# Patient Record
Sex: Male | Born: 1935 | Race: White | Hispanic: No | Marital: Single | Smoking: Current every day smoker
Health system: Southern US, Community
[De-identification: ages and names within clinical notes are randomized; demographics above are authoritative.]

## PROBLEM LIST (undated history)

## (undated) DIAGNOSIS — E039 Hypothyroidism, unspecified: Secondary | ICD-10-CM

## (undated) DIAGNOSIS — D649 Anemia, unspecified: Secondary | ICD-10-CM

## (undated) DIAGNOSIS — N529 Male erectile dysfunction, unspecified: Secondary | ICD-10-CM

## (undated) DIAGNOSIS — K819 Cholecystitis, unspecified: Secondary | ICD-10-CM

## (undated) DIAGNOSIS — K635 Polyp of colon: Secondary | ICD-10-CM

## (undated) DIAGNOSIS — R0602 Shortness of breath: Secondary | ICD-10-CM

## (undated) DIAGNOSIS — R600 Localized edema: Secondary | ICD-10-CM

## (undated) DIAGNOSIS — Z8249 Family history of ischemic heart disease and other diseases of the circulatory system: Secondary | ICD-10-CM

## (undated) DIAGNOSIS — I4510 Unspecified right bundle-branch block: Secondary | ICD-10-CM

## (undated) DIAGNOSIS — R0609 Other forms of dyspnea: Secondary | ICD-10-CM

## (undated) DIAGNOSIS — R0600 Dyspnea, unspecified: Secondary | ICD-10-CM

## (undated) DIAGNOSIS — IMO0002 Reserved for concepts with insufficient information to code with codable children: Secondary | ICD-10-CM

## (undated) DIAGNOSIS — I639 Cerebral infarction, unspecified: Secondary | ICD-10-CM

## (undated) DIAGNOSIS — J449 Chronic obstructive pulmonary disease, unspecified: Secondary | ICD-10-CM

## (undated) DIAGNOSIS — D126 Benign neoplasm of colon, unspecified: Secondary | ICD-10-CM

## (undated) HISTORY — DX: Anemia, unspecified: D64.9

## (undated) HISTORY — DX: Reserved for concepts with insufficient information to code with codable children: IMO0002

## (undated) HISTORY — DX: Unspecified right bundle-branch block: I45.10

## (undated) HISTORY — DX: Cerebral infarction, unspecified: I63.9

## (undated) HISTORY — PX: COLONOSCOPY: SHX174

## (undated) HISTORY — PX: ESOPHAGOGASTRODUODENOSCOPY: SHX1529

## (undated) HISTORY — DX: Polyp of colon: K63.5

## (undated) HISTORY — DX: Family history of ischemic heart disease and other diseases of the circulatory system: Z82.49

## (undated) HISTORY — PX: CATARACT EXTRACTION, BILATERAL: SHX1313

## (undated) HISTORY — DX: Hypothyroidism, unspecified: E03.9

## (undated) HISTORY — DX: Other forms of dyspnea: R06.09

## (undated) HISTORY — DX: Cholecystitis, unspecified: K81.9

## (undated) HISTORY — DX: Benign neoplasm of colon, unspecified: D12.6

## (undated) HISTORY — DX: Dyspnea, unspecified: R06.00

## (undated) HISTORY — DX: Male erectile dysfunction, unspecified: N52.9

---

## 1996-10-16 DIAGNOSIS — I639 Cerebral infarction, unspecified: Secondary | ICD-10-CM

## 1996-10-16 HISTORY — PX: HERNIA REPAIR: SHX51

## 1996-10-16 HISTORY — DX: Cerebral infarction, unspecified: I63.9

## 1998-10-16 HISTORY — PX: EYE SURGERY: SHX253

## 2002-10-16 HISTORY — PX: OTHER SURGICAL HISTORY: SHX169

## 2002-11-07 ENCOUNTER — Encounter: Payer: Self-pay | Admitting: Surgery

## 2002-11-07 ENCOUNTER — Inpatient Hospital Stay (HOSPITAL_COMMUNITY): Admission: EM | Admit: 2002-11-07 | Discharge: 2002-11-14 | Payer: Self-pay | Admitting: Surgery

## 2002-11-07 ENCOUNTER — Encounter (INDEPENDENT_AMBULATORY_CARE_PROVIDER_SITE_OTHER): Payer: Self-pay

## 2004-08-24 ENCOUNTER — Ambulatory Visit: Payer: Self-pay | Admitting: Family Medicine

## 2004-09-07 ENCOUNTER — Ambulatory Visit: Payer: Self-pay | Admitting: Family Medicine

## 2004-09-28 ENCOUNTER — Ambulatory Visit: Payer: Self-pay | Admitting: Family Medicine

## 2005-08-04 ENCOUNTER — Ambulatory Visit: Payer: Self-pay | Admitting: Family Medicine

## 2005-09-11 ENCOUNTER — Ambulatory Visit: Payer: Self-pay | Admitting: Family Medicine

## 2005-09-11 ENCOUNTER — Encounter: Admission: RE | Admit: 2005-09-11 | Discharge: 2005-09-11 | Payer: Self-pay | Admitting: Family Medicine

## 2006-11-20 ENCOUNTER — Ambulatory Visit: Payer: Self-pay | Admitting: Family Medicine

## 2006-11-20 LAB — CONVERTED CEMR LAB
ALT: 13 units/L (ref 0–40)
AST: 20 units/L (ref 0–37)
Albumin: 3.9 g/dL (ref 3.5–5.2)
BUN: 19 mg/dL (ref 6–23)
Basophils Absolute: 0.1 10*3/uL (ref 0.0–0.1)
Basophils Relative: 1.2 % — ABNORMAL HIGH (ref 0.0–1.0)
CO2: 31 meq/L (ref 19–32)
Calcium: 9.3 mg/dL (ref 8.4–10.5)
Chloride: 107 meq/L (ref 96–112)
Cholesterol: 171 mg/dL (ref 0–200)
Creatinine, Ser: 1.4 mg/dL (ref 0.4–1.5)
Eosinophils Absolute: 0.2 10*3/uL (ref 0.0–0.6)
Eosinophils Relative: 3.7 % (ref 0.0–5.0)
GFR calc Af Amer: 64 mL/min
GFR calc non Af Amer: 53 mL/min
Glucose, Bld: 97 mg/dL (ref 70–99)
HCT: 43.3 % (ref 39.0–52.0)
HDL: 42.2 mg/dL (ref >39.0)
Hemoglobin: 15.1 g/dL (ref 13.0–17.0)
LDL Cholesterol: 115 mg/dL — ABNORMAL HIGH (ref 0–99)
Lymphocytes Relative: 29.5 % (ref 12.0–46.0)
MCHC: 34.8 g/dL (ref 30.0–36.0)
MCV: 84.9 fL (ref 78.0–100.0)
Monocytes Absolute: 0.6 10*3/uL (ref 0.2–0.7)
Monocytes Relative: 9.4 % (ref 3.0–11.0)
Neutro Abs: 3.8 10*3/uL (ref 1.4–7.7)
Neutrophils Relative %: 56.2 % (ref 43.0–77.0)
PSA: 2.22 ng/mL
PSA: 2.22 ng/mL (ref 0.10–4.00)
Phosphorus: 3 mg/dL (ref 2.3–4.6)
Platelets: 191 10*3/uL (ref 150–400)
Potassium: 4.4 meq/L (ref 3.5–5.1)
RBC: 5.1 M/uL (ref 4.22–5.81)
RDW: 14 % (ref 11.5–14.6)
Sodium: 143 meq/L (ref 135–145)
TSH: 0.61 microintl units/mL
TSH: 0.61 microintl units/mL (ref 0.35–5.50)
Total CHOL/HDL Ratio: 4.1
Triglycerides: 67 mg/dL (ref 0–149)
VLDL: 13 mg/dL (ref 0–40)
WBC: 6.7 10*3/uL (ref 4.5–10.5)

## 2006-12-04 ENCOUNTER — Ambulatory Visit: Payer: Self-pay | Admitting: Family Medicine

## 2006-12-19 ENCOUNTER — Ambulatory Visit: Payer: Self-pay | Admitting: Family Medicine

## 2007-07-04 ENCOUNTER — Ambulatory Visit: Payer: Self-pay | Admitting: Family Medicine

## 2007-07-04 DIAGNOSIS — I6789 Other cerebrovascular disease: Secondary | ICD-10-CM | POA: Insufficient documentation

## 2007-07-04 DIAGNOSIS — M5137 Other intervertebral disc degeneration, lumbosacral region: Secondary | ICD-10-CM

## 2007-07-04 DIAGNOSIS — E039 Hypothyroidism, unspecified: Secondary | ICD-10-CM | POA: Insufficient documentation

## 2007-07-25 ENCOUNTER — Telehealth: Payer: Self-pay | Admitting: Family Medicine

## 2008-07-22 ENCOUNTER — Ambulatory Visit: Payer: Self-pay | Admitting: Family Medicine

## 2008-07-22 DIAGNOSIS — Z860100 Personal history of colon polyps, unspecified: Secondary | ICD-10-CM | POA: Insufficient documentation

## 2008-07-22 DIAGNOSIS — L723 Sebaceous cyst: Secondary | ICD-10-CM

## 2008-07-22 DIAGNOSIS — J449 Chronic obstructive pulmonary disease, unspecified: Secondary | ICD-10-CM | POA: Insufficient documentation

## 2008-07-23 LAB — CONVERTED CEMR LAB
ALT: 12 units/L (ref 0–53)
AST: 19 units/L (ref 0–37)
Albumin: 4.1 g/dL (ref 3.5–5.2)
Alkaline Phosphatase: 76 units/L (ref 39–117)
BUN: 18 mg/dL (ref 6–23)
Basophils Absolute: 0.1 10*3/uL (ref 0.0–0.1)
Basophils Relative: 1.4 % (ref 0.0–3.0)
Bilirubin, Direct: 0.1 mg/dL (ref 0.0–0.3)
CO2: 34 meq/L — ABNORMAL HIGH (ref 19–32)
Calcium: 9 mg/dL (ref 8.4–10.5)
Chloride: 104 meq/L (ref 96–112)
Cholesterol: 164 mg/dL (ref 0–200)
Creatinine, Ser: 1.2 mg/dL (ref 0.4–1.5)
Eosinophils Absolute: 0.4 10*3/uL (ref 0.0–0.7)
Eosinophils Relative: 5.8 % — ABNORMAL HIGH (ref 0.0–5.0)
Free T4: 1.7 ng/dL — ABNORMAL HIGH (ref 0.6–1.6)
GFR calc Af Amer: 76 mL/min
GFR calc non Af Amer: 63 mL/min
Glucose, Bld: 100 mg/dL — ABNORMAL HIGH (ref 70–99)
HCT: 41 % (ref 39.0–52.0)
HDL: 42 mg/dL (ref >39.0)
Hemoglobin: 13.5 g/dL (ref 13.0–17.0)
LDL Cholesterol: 109 mg/dL — ABNORMAL HIGH (ref 0–99)
Lymphocytes Relative: 29.4 % (ref 12.0–46.0)
MCHC: 33 g/dL (ref 30.0–36.0)
MCV: 80.1 fL (ref 78.0–100.0)
Monocytes Absolute: 0.6 10*3/uL (ref 0.1–1.0)
Monocytes Relative: 9.3 % (ref 3.0–12.0)
Neutro Abs: 3.5 10*3/uL (ref 1.4–7.7)
Neutrophils Relative %: 54.1 % (ref 43.0–77.0)
PSA: 2.35 ng/mL (ref 0.10–4.00)
Phosphorus: 3.1 mg/dL (ref 2.3–4.6)
Platelets: 214 10*3/uL (ref 150–400)
Potassium: 5 meq/L (ref 3.5–5.1)
RBC: 5.12 M/uL (ref 4.22–5.81)
RDW: 14.9 % — ABNORMAL HIGH (ref 11.5–14.6)
Sodium: 144 meq/L (ref 135–145)
TSH: 0.48 microintl units/mL (ref 0.35–5.50)
Total Bilirubin: 0.5 mg/dL (ref 0.3–1.2)
Total CHOL/HDL Ratio: 3.9
Total Protein: 7.8 g/dL (ref 6.0–8.3)
Triglycerides: 63 mg/dL (ref 0–149)
VLDL: 13 mg/dL (ref 0–40)
WBC: 6.5 10*3/uL (ref 4.5–10.5)

## 2008-08-05 ENCOUNTER — Ambulatory Visit: Payer: Self-pay | Admitting: Internal Medicine

## 2008-08-20 ENCOUNTER — Ambulatory Visit: Payer: Self-pay | Admitting: Internal Medicine

## 2008-08-20 ENCOUNTER — Encounter: Payer: Self-pay | Admitting: Internal Medicine

## 2008-08-20 LAB — HM COLONOSCOPY

## 2008-08-27 ENCOUNTER — Encounter: Payer: Self-pay | Admitting: Internal Medicine

## 2008-08-27 ENCOUNTER — Encounter: Payer: Self-pay | Admitting: Family Medicine

## 2009-07-29 ENCOUNTER — Ambulatory Visit: Payer: Self-pay | Admitting: Family Medicine

## 2009-09-14 ENCOUNTER — Ambulatory Visit: Payer: Self-pay | Admitting: Family Medicine

## 2009-09-14 DIAGNOSIS — R634 Abnormal weight loss: Secondary | ICD-10-CM

## 2009-09-14 DIAGNOSIS — F17200 Nicotine dependence, unspecified, uncomplicated: Secondary | ICD-10-CM | POA: Insufficient documentation

## 2009-09-15 ENCOUNTER — Encounter (INDEPENDENT_AMBULATORY_CARE_PROVIDER_SITE_OTHER): Payer: Self-pay | Admitting: *Deleted

## 2009-09-15 ENCOUNTER — Encounter: Admission: RE | Admit: 2009-09-15 | Discharge: 2009-09-15 | Payer: Self-pay | Admitting: Family Medicine

## 2009-09-15 LAB — CONVERTED CEMR LAB
ALT: 12 units/L (ref 0–53)
AST: 22 units/L (ref 0–37)
Basophils Relative: 1.5 % (ref 0.0–3.0)
CO2: 32 meq/L (ref 19–32)
Calcium: 9.4 mg/dL (ref 8.4–10.5)
Chloride: 102 meq/L (ref 96–112)
Creatinine, Ser: 1.1 mg/dL (ref 0.4–1.5)
Eosinophils Relative: 5.7 % — ABNORMAL HIGH (ref 0.0–5.0)
GFR calc non Af Amer: 69.48 mL/min (ref >60)
HCT: 39.5 % (ref 39.0–52.0)
HDL: 44.6 mg/dL (ref >39.00)
Hemoglobin: 12.8 g/dL — ABNORMAL LOW (ref 13.0–17.0)
Lymphs Abs: 2.2 10*3/uL (ref 0.7–4.0)
MCV: 79.2 fL (ref 78.0–100.0)
Monocytes Absolute: 0.6 10*3/uL (ref 0.1–1.0)
Monocytes Relative: 8 % (ref 3.0–12.0)
Neutro Abs: 3.9 10*3/uL (ref 1.4–7.7)
PSA: 1.98 ng/mL (ref 0.10–4.00)
Potassium: 4.1 meq/L (ref 3.5–5.1)
RBC: 4.99 M/uL (ref 4.22–5.81)
Sodium: 140 meq/L (ref 135–145)
Total Protein: 8 g/dL (ref 6.0–8.3)
WBC: 7.2 10*3/uL (ref 4.5–10.5)

## 2009-10-29 ENCOUNTER — Ambulatory Visit: Payer: Self-pay | Admitting: Family Medicine

## 2009-11-01 LAB — CONVERTED CEMR LAB
Free T4: 1.2 ng/dL (ref 0.6–1.6)
TSH: 1.35 microintl units/mL (ref 0.35–5.50)

## 2010-07-13 ENCOUNTER — Telehealth: Payer: Self-pay | Admitting: Family Medicine

## 2010-07-19 ENCOUNTER — Ambulatory Visit: Payer: Self-pay | Admitting: Family Medicine

## 2010-09-19 ENCOUNTER — Telehealth (INDEPENDENT_AMBULATORY_CARE_PROVIDER_SITE_OTHER): Payer: Self-pay | Admitting: *Deleted

## 2010-09-20 ENCOUNTER — Ambulatory Visit: Payer: Self-pay | Admitting: Family Medicine

## 2010-09-21 LAB — CONVERTED CEMR LAB
ALT: 8 units/L (ref 0–53)
Albumin: 3.9 g/dL (ref 3.5–5.2)
Basophils Absolute: 0.1 10*3/uL (ref 0.0–0.1)
Calcium: 9 mg/dL (ref 8.4–10.5)
Creatinine, Ser: 1.1 mg/dL (ref 0.4–1.5)
Eosinophils Absolute: 0.3 10*3/uL (ref 0.0–0.7)
Glucose, Bld: 106 mg/dL — ABNORMAL HIGH (ref 70–99)
HCT: 31.5 % — ABNORMAL LOW (ref 39.0–52.0)
LDL Cholesterol: 86 mg/dL (ref 0–99)
Lymphs Abs: 2 10*3/uL (ref 0.7–4.0)
MCV: 65.9 fL — ABNORMAL LOW (ref 78.0–100.0)
Monocytes Absolute: 0.7 10*3/uL (ref 0.1–1.0)
Neutrophils Relative %: 52.4 % (ref 43.0–77.0)
PSA: 2.36 ng/mL (ref 0.10–4.00)
Phosphorus: 2.8 mg/dL (ref 2.3–4.6)
Platelets: 193 10*3/uL (ref 150.0–400.0)
Potassium: 4.7 meq/L (ref 3.5–5.1)
RDW: 18.6 % — ABNORMAL HIGH (ref 11.5–14.6)
Sodium: 142 meq/L (ref 135–145)
TSH: 0.65 microintl units/mL (ref 0.35–5.50)
Total CHOL/HDL Ratio: 4
Triglycerides: 46 mg/dL (ref 0.0–149.0)
WBC: 6.4 10*3/uL (ref 4.5–10.5)

## 2010-09-22 ENCOUNTER — Ambulatory Visit: Payer: Self-pay | Admitting: Family Medicine

## 2010-09-22 ENCOUNTER — Encounter (INDEPENDENT_AMBULATORY_CARE_PROVIDER_SITE_OTHER): Payer: Self-pay | Admitting: *Deleted

## 2010-09-22 DIAGNOSIS — D649 Anemia, unspecified: Secondary | ICD-10-CM

## 2010-09-25 LAB — CONVERTED CEMR LAB: Iron: 14 ug/dL — ABNORMAL LOW (ref 42–165)

## 2010-09-26 ENCOUNTER — Ambulatory Visit: Payer: Self-pay | Admitting: Family Medicine

## 2010-09-26 DIAGNOSIS — H6120 Impacted cerumen, unspecified ear: Secondary | ICD-10-CM | POA: Insufficient documentation

## 2010-09-26 DIAGNOSIS — K4090 Unilateral inguinal hernia, without obstruction or gangrene, not specified as recurrent: Secondary | ICD-10-CM | POA: Insufficient documentation

## 2010-09-26 LAB — CONVERTED CEMR LAB
HDL goal, serum: 40 mg/dL
LDL Goal: 130 mg/dL

## 2010-09-27 ENCOUNTER — Ambulatory Visit: Payer: Self-pay | Admitting: Family Medicine

## 2010-09-27 ENCOUNTER — Encounter (INDEPENDENT_AMBULATORY_CARE_PROVIDER_SITE_OTHER): Payer: Self-pay | Admitting: *Deleted

## 2010-09-27 ENCOUNTER — Ambulatory Visit: Payer: Self-pay | Admitting: Internal Medicine

## 2010-09-27 DIAGNOSIS — D509 Iron deficiency anemia, unspecified: Secondary | ICD-10-CM | POA: Insufficient documentation

## 2010-09-27 DIAGNOSIS — R195 Other fecal abnormalities: Secondary | ICD-10-CM

## 2010-09-28 LAB — FECAL OCCULT BLOOD, GUAIAC: Fecal Occult Blood: NEGATIVE

## 2010-10-03 ENCOUNTER — Encounter: Payer: Self-pay | Admitting: Family Medicine

## 2010-10-03 LAB — CONVERTED CEMR LAB: Fecal Occult Bld: NEGATIVE

## 2010-10-18 ENCOUNTER — Ambulatory Visit
Admission: RE | Admit: 2010-10-18 | Discharge: 2010-10-18 | Payer: Self-pay | Source: Home / Self Care | Attending: Family Medicine | Admitting: Family Medicine

## 2010-10-18 DIAGNOSIS — N529 Male erectile dysfunction, unspecified: Secondary | ICD-10-CM | POA: Insufficient documentation

## 2010-10-20 ENCOUNTER — Ambulatory Visit
Admission: RE | Admit: 2010-10-20 | Discharge: 2010-10-20 | Payer: Self-pay | Source: Home / Self Care | Attending: Internal Medicine | Admitting: Internal Medicine

## 2010-10-20 ENCOUNTER — Encounter: Payer: Self-pay | Admitting: Internal Medicine

## 2010-10-25 ENCOUNTER — Encounter: Payer: Self-pay | Admitting: Internal Medicine

## 2010-10-28 ENCOUNTER — Ambulatory Visit
Admission: RE | Admit: 2010-10-28 | Discharge: 2010-10-28 | Payer: Self-pay | Source: Home / Self Care | Attending: Internal Medicine | Admitting: Internal Medicine

## 2010-11-15 NOTE — Assessment & Plan Note (Signed)
Summary: FLU SHOT/CLE  Nurse Visit   Allergies: No Known Drug Allergies  Orders Added: 1)  Flu Vaccine 3yrs + MEDICARE PATIENTS [Q2039] 2)  Administration Flu vaccine - MCR [G0008]  Flu Vaccine Consent Questions     Do you have a history of severe allergic reactions to this vaccine? no    Any prior history of allergic reactions to egg and/or gelatin? no    Do you have a sensitivity to the preservative Thimersol? no    Do you have a past history of Guillan-Barre Syndrome? no    Do you currently have an acute febrile illness? no    Have you ever had a severe reaction to latex? no    Vaccine information given and explained to patient? yes    Are you currently pregnant? no    Lot Number:[id number]   Exp Date:04/15/2011   Site Given  Left Deltoid IMu  

## 2010-11-15 NOTE — Progress Notes (Signed)
Summary: needs generic synthroid  Phone Note From Pharmacy   [facility]  [address] [phone] Summary of Call: Pt has been getting brand synthroid, but this is now on manufacturer back order.  Pharmacy is asking if ok to give generic with next refill. Initial call taken by: Lowella Petties CMA,  July 13, 2010 11:49 AM  Follow-up for Phone Call        that is ok with me if ok with him--let the pt know please Follow-up by: Judith Part MD,  July 13, 2010 12:06 PM  Additional Follow-up for Phone Call Additional follow up Details #1::        Patient's wife notified as instructed by telephone. Pt's wife said pt already knew because had spoken with pharmacist. Sherron Monday with [facility]ona Rieger at [facility] notified as instructed by telephone. Lewanda Rife LPN  July 13, 2010 12:19 PM

## 2010-11-15 NOTE — Letter (Signed)
Summary: [facility]: Immunoassay Fecal Occult Blood (iFOB) Order Form  [facility] at [facility]  [address] [place], [place] [address]   Phone: [phone]  Fax: [phone]      [facility]: Immunoassay Fecal Occult Blood (iFOB) Order Form   September 22, 2010 MRN: [id number]   Carl Mcconnell 06/09/1933   Physicican Name:_____Marne Tower____________________  Diagnosis Code:_____286.9_____________________      Mills Koller

## 2010-11-15 NOTE — Progress Notes (Signed)
----   Converted from flag ---- ---- 09/18/2010 7:13 AM, Colon Flattery Tower MD wrote: lipid/ast/alt/renal / tsh/ cbc with diff/ psa -- for 244.9 and prostate screening and hx of cva thanks  ---- 09/16/2010 8:51 AM, Liane Comber CMA (AAMA) wrote: this order seemed like it could have been cut off, was this all you wanted to order Thanks Tasha  ---- 09/15/2010 9:58 PM, Colon Flattery Tower MD wrote: for 244.9 and cva and prostate screen lipid/ ast alt/ renal / tsh / cbc with diff / psa -- thanks  ---- 09/14/2010 10:55 AM, Liane Comber CMA (AAMA) wrote: Lab orders please! Good Morning! This pt is scheduled for cpx labs Tuesday, which labs to draw and dx codes to use? Thanks Tasha ------------------------------

## 2010-11-17 NOTE — Assessment & Plan Note (Signed)
Summary: CPX-BCBS PRIMARY , MEDICARE AND MEDICAID.Marland Kitchen CYD   Vital Signs:  Patient profile:   77 year old male Height:      66.5 inches Weight:      140.25 pounds BMI:     22.38 Temp:     97.4 degrees F oral Pulse rate:   60 / minute Pulse rhythm:   regular BP sitting:   124 / 82  (left arm) Cuff size:   regular  Vitals Entered By: Lewanda Rife LPN (September 26, 2010 10:47 AM) CC: CPX, Lipid Management   History of Present Illness: here for check up of chronic med conditions and also to [facility]  recent labs chol good Last Lipid ProfileCholesterol: 132 (09/20/2010 8:45:54 AM)HDL:  37.10 (09/20/2010 8:45:54 AM)LDL:  86 (09/20/2010 8:45:54 AM)Triglycerides:  Last Liver profileSGOT:  20 (09/20/2010 8:45:54 AM)SPGT:  8 (09/20/2010 8:45:54 AM)T. Bili:  0.6 (09/14/2009 3:09:36 PM)Alk Phos:  87 (09/14/2009 3:09:36 PM)   new anemia per pt he was anemic when very young  he eats once per day -- balanced diet  hb 9.8 ferritin 6.6 - appears to be iron dev B12 is 227- low nl  has not donated blood   has no energy and wants to sleep all the time  dozes off easily appetite is decreased   psa 2.36  (was 1.98) fairly stable  colonosc tic s and polyp 09- was supposed to have 5 y f/u   smoking status -- still smoker he tried the chantix -- on the starter pack did well  on the full dose got very nauseated  would like to try the low dose   Td 08 flu shot utd  ptx is 09    wt is down 5 lb-- eating less in general   bp is fine   has hx of inguinal hernia on L and now thinks he is getting mild one in R no pain- just a little bulge when he coughs- and it goes back in    Lipid Management History:      Positive NCEP/ATP III risk factors include male age 9 years old or older, HDL cholesterol less than 40, and current tobacco user.    Allergies (verified): 1)  ! Chantix (Varenicline Tartrate)  Past History:  Past Medical History: Last updated:  2008/08/09 Hypothyroidism CVA deg disk LS  smoker colon polyp  Past Surgical History: Last updated: 08/27/2008 Eye surgery (2000) Hernia surgery (1998) Rectal abscess , foreign body- surgery (10/2002) Carotid dopplers- neg (06/2003) Colonoscopy- diverticulosis, rectal polyp, hemorrhoids (08/2003) colonoscopy - diverticulosis and rectal polyp (11/09)- re check 5 y   surg Dr Carolynne Edouard, [facility]  Family History: Last updated: Aug 09, 2008 Father: died age 68- CVA Mother: CAD Siblings: brother died from MI, sister with diab, HTN no cancer in family  Social History: Last updated: 2008/08/09 Marital Status:divorced Children: one daughter with epilepsy, DM. 1 son- healthy Occupation: retired smokes 1ppd   Risk Factors: Smoking Status: current (07/04/2007)  Review of Systems General:  Complains of fatigue; denies chills, fever, and loss of appetite. Eyes:  Denies blurring and eye irritation. ENT:  Denies sore throat. CV:  Denies chest pain or discomfort, palpitations, shortness of breath with exertion, and swelling of feet. Resp:  Denies cough and shortness of breath. GI:  Denies abdominal pain, bloody stools, change in bowel habits, dark tarry stools, indigestion, loss of appetite, nausea, and vomiting. GU:  Denies hematuria and nocturia. MS:  Denies joint pain, joint redness, and  joint swelling. Derm:  Denies itching, lesion(s), poor wound healing, and rash. Neuro:  Denies headaches, numbness, and tingling. Psych:  Denies anxiety. Endo:  Denies cold intolerance, excessive thirst, and excessive urination. Heme:  Denies abnormal bruising and bleeding.  Physical Exam  General:  slim and well appearing but sallow complexion Head:  normocephalic, atraumatic, and no abnormalities observed.   Eyes:  vision grossly intact, pupils equal, pupils round, and pupils reactive to light.  no conjunctival pallor, injection or icterus  Ears:  bilateral cerumen  impaction- almost total with some dec in hearing  Nose:  no nasal discharge.   Mouth:  pharynx pink and moist.   Neck:  supple with full rom and no masses or thyromegally, no JVD or carotid bruit  Chest Wall:  No deformities, masses, tenderness or gynecomastia noted. Lungs:  Normal respiratory effort, chest expands symmetrically. Lungs are clear to auscultation, no crackles or wheezes. Heart:  Normal rate and regular rhythm. S1 and S2 normal without gallop, murmur, click, rub or other extra sounds. Abdomen:  Bowel sounds positive,abdomen soft and non-tender without masses, organomegally  very slight bulge on valsalva in R  groin area is reducible and nontender Rectal:  No external abnormalities noted. Normal sphincter tone. No rectal masses or tenderness. scant heme pos stool on guiac card  Genitalia:  Testes bilaterally descended without nodularity, tenderness or masses. No scrotal masses or lesions. No penis lesions or urethral discharge no inguinal hernia detected  Prostate:  Prostate gland firm and smooth, no enlargement, nodularity, tenderness, mass, asymmetry or induration. Msk:  No deformity or scoliosis noted of thoracic or lumbar spine.   Pulses:  R and L carotid,radial,femoral,dorsalis pedis and posterior tibial pulses are full and equal bilaterally Extremities:  No clubbing, cyanosis, edema, or deformity noted with normal full range of motion of all joints.   Neurologic:  sensation intact to light touch and gait normal.   Skin:  Intact without suspicious lesions or rashes Cervical Nodes:  No lymphadenopathy noted Inguinal Nodes:  No significant adenopathy Psych:  normal affect, talkative and pleasant    Impression & Recommendations:  Problem # 1:  UNSPECIFIED ANEMIA (ICD-285.9) Assessment Unchanged this is new and significant- iron def with scant heme pos stool today ref to GI for eval Orders: Gastroenterology Referral (GI)  Problem # 2:  INGUINAL HERNIA  (ICD-550.90) Assessment: New very slight bulge in R upper groin with straining but neg testicular  at this time will obs it given other issues if worse/bigger/pain- pt knows to call (he has had one before )  Problem # 3:  CERUMEN IMPACTION, BILATERAL (ICD-380.4) Assessment: Deteriorated this is getting bad again with dec hearing inst to use debrox 2 times weekly for 2-3 wk and f/u for simple ear irrigation  Problem # 4:  TOBACCO ABUSE (ICD-305.1) Assessment: Unchanged  overall tol the lower dose of chantix  will px that and see if we make progress  last cxr was last year  disc copd  The following medications were removed from the medication list:    Chantix Starting Month Pak 0.5 Mg X 11 & 1 Mg X 42 Tabs (Varenicline tartrate) .Marland Kitchen... Take by mouth as directed    Chantix 1 Mg Tabs (Varenicline tartrate) .Marland Kitchen... 1 by mouth two times a day after finishing starter pack His updated medication list for this problem includes:    Chantix 0.5 Mg Tabs (Varenicline tartrate) .Marland Kitchen... 1 by mouth two times a day to quit smoking  Orders: Prescription Created Electronically (  [email or web address])  Problem # 5:  WEIGHT LOSS (ICD-783.21) Assessment: Unchanged 5 more lb worrisome given the anemia  ref made to GI pt aware he is at higher risk of ca due to smoking  Problem # 6:  HYPOTHYROIDISM (ICD-244.9) Assessment: Unchanged  tsh is tx and stable fatigue is more likely coming from his anemia  His updated medication list for this problem includes:    Synthroid 125 Mcg Tabs (Levothyroxine sodium) .Marland Kitchen... Take one by mouth daily  Labs Reviewed: TSH: 0.65 (09/20/2010)    Chol: 132 (09/20/2010)   HDL: 37.10 (09/20/2010)   LDL: 86 (09/20/2010)   TG: 46.0 (09/20/2010)  Orders: Prescription Created Electronically ([id number])  Complete Medication List: 1)  Synthroid 125 Mcg Tabs (Levothyroxine sodium) .... Take one by mouth daily 2)  Adult Aspirin Low Strength 81 Mg Tbdp (Aspirin) .... One by mouth dialy 3)  Chantix  0.5 Mg Tabs (Varenicline tartrate) .Marland Kitchen.. 1 by mouth two times a day to quit smoking  Lipid Assessment/Plan:      Based on NCEP/ATP III, the patient's risk factor category is "2 or more risk factors and a calculated 10 year CAD risk of < 20%".  The patient's lipid goals are as follows: Total cholesterol goal is 200; LDL cholesterol goal is 130; HDL cholesterol goal is 40; Triglyceride goal is 150.     Patient Instructions: 1)  try low dose chantix again to quit smoking 2)  we will refer you to GI for anemia at check out  3)  please use debrox as directed over the counter twice weekly for 2-3 weeks in both ears to loosen wax 4)  then f/u here in 2-3 weeks to have ears flushed  5)  I think you may be developing a very early hernia in right groin-- watch this-- if it hurts or gets bigger -- we will refer you to a surgeon  Prescriptions: SYNTHROID 125 MCG TABS (LEVOTHYROXINE SODIUM) take one by mouth daily  #30 x 11   Entered and Authorized by:   Judith Part MD   Signed by:   Judith Part MD on 09/26/2010   Method used:   [facility]        [facility]  [address] [facility] [address] [id number]       [address]       [place], [place]  [address]       Ph: [phone]       Fax: [phone]   RxID:   [id number] CHANTIX 0.5 MG TABS (VARENICLINE TARTRATE) 1 by mouth two times a day to quit smoking  #60 x 3   Entered and Authorized by:   Judith Part MD   Signed by:   Judith Part MD on 09/26/2010   Method used:   [facility]        [facility]  [address] [facility] [address] [id number]       [address]       [place], [place]  [address]       Ph: [phone]       Fax: [phone]   RxID:   [id number]    Orders Added: 1)  Gastroenterology Referral [GI] 2)  Prescription Created Electronically [G8553] 3)  Est. Patient Level IV [93818]    Current Allergies (reviewed today): ! CHANTIX (VARENICLINE TARTRATE)

## 2010-11-17 NOTE — Letter (Signed)
Summary: EGD Instructions  [facility]  [address] [place], [place] [address]   Phone: [phone]  Fax: [phone]       Carl Mcconnell    03-05-1933    MRN: [id number]       Procedure Day Dorna Bloom: Lenor Coffin, 10/20/10     Arrival Time: 1:30 PM     Procedure Time: 2:30 PM    Location of Procedure:                    _ X_ [facility] (4th Floor)  PREPARATION FOR ENDOSCOPY   On THURSDAY, 10/20/10, THE DAY OF THE PROCEDURE:  1.   No solid foods, milk or milk products are allowed after midnight the night before your procedure.  2.   Do not drink anything colored red or purple.  Avoid juices with pulp.  No orange juice.  3.  You may drink clear liquids until 12:30 PM, which is 2 hours before your procedure.                                                                                                CLEAR LIQUIDS INCLUDE: Water Jello Ice Popsicles Tea (sugar ok, no milk/cream) Powdered fruit flavored drinks Coffee (sugar ok, no milk/cream) Gatorade Juice: apple, white grape, white cranberry  Lemonade Clear bullion, consomm, broth Carbonated beverages (any kind) Strained chicken noodle soup Hard Candy   MEDICATION INSTRUCTIONS  Unless otherwise instructed, you should take regular prescription medications with a small sip of water as early as possible the morning of your procedure.                   OTHER INSTRUCTIONS  You will need a responsible adult at least 75 years of age to accompany you and drive you home.   This person must remain in the waiting room during your procedure.  Wear loose fitting clothing that is easily removed.  Leave jewelry and other valuables at home.  However, you may wish to bring a book to read or an iPod/MP3 player to listen to music as you wait for your procedure to start.  Remove all body piercing jewelry and leave at home.  Total time from sign-in until discharge is approximately 2-3 hours.  You  should go home directly after your procedure and rest.  You can resume normal activities the day after your procedure.  The day of your procedure you should not:   Drive   Make legal decisions   Operate machinery   Drink alcohol   Return to work  You will receive specific instructions about eating, activities and medications before you leave.    The above instructions have been reviewed and explained to me by   _______________________    I fully understand and can verbalize these instructions _____________________________ Date _________

## 2010-11-17 NOTE — Assessment & Plan Note (Signed)
Summary: ear flush/mk   Vital Signs:  Patient profile:   77 year old male Height:      66.5 inches Weight:      148.50 pounds BMI:     23.69 Temp:     97.6 degrees F oral Pulse rate:   64 / minute Pulse rhythm:   regular BP sitting:   100 / 60  (left arm) Cuff size:   regular  Vitals Entered By: Lewanda Rife LPN (October 18, 2010 12:18 PM) CC: ear washing   History of Present Illness: here to get ears flushed -- has been using debrox   has chantix -- and no cig since saturday   is having some erectile dysfunction  has never tried viagra or other med  is not a big problem - does not want to address it yet  no drive problems    Allergies: 1)  ! Chantix (Varenicline Tartrate)  Past History:  Past Surgical History: Last updated: 10/16/10 Eye surgery (2000)-Left eye Hernia surgery (1998) Rectal abscess , foreign body- surgery (10/2002) Carotid dopplers- neg (06/2003) Colonoscopy- diverticulosis, rectal polyp, hemorrhoids (08/2003) colonoscopy - diverticulosis and rectal polyp (11/09)- re check 5 y   surg Dr Carolynne Edouard, [facility]-- [facility]  Family History: Last updated: Oct 16, 2010 Father: died age 23- CVA Mother: CAD Siblings: brother died from MI, sister with diab, HTN no cancer in family Family History of Diabetes: sister, daughter  Social History: Last updated: 2010-10-16 Marital Status:divorced lives with girlfriend > 20 yrs Children: one daughter with epilepsy, DM. 1 son- healthy Occupation: retired smokes 1ppd trying to quit Alcohol Use - no Daily Caffeine Use-8 cups coffee daily Illicit Drug Use - no Patient does not get regular exercise.   Risk Factors: Exercise: no (16-Oct-2010)  Risk Factors: Smoking Status: current (07/04/2007)  Past Medical History: Hypothyroidism CVA deg disk LS  smoker colon polyp ED anemia   Review of Systems General:  Denies fatigue, loss of appetite, and malaise. Eyes:  Denies blurring and eye  irritation. ENT:  Complains of decreased hearing; denies ear discharge and earache. CV:  Denies chest pain or discomfort, lightheadness, and palpitations. Resp:  Denies cough, shortness of breath, and wheezing. GI:  Denies abdominal pain, change in bowel habits, indigestion, and nausea. GU:  Complains of erectile dysfunction; denies dysuria and urinary frequency.  Physical Exam  General:  slim and well appearing Head:  normocephalic, atraumatic, and no abnormalities observed.   Eyes:  vision grossly intact, pupils equal, pupils round, and pupils reactive to light.   Ears:  total occlusion of soft wax bilat cleared with simple irrigation  hearing improved TMs nl appearing  Mouth:  pharynx pink and moist.   Neck:  supple with full rom and no masses or thyromegally, no JVD or carotid bruit  Lungs:  diffusely distant bs  Heart:  Normal rate and regular rhythm. S1 and S2 normal without gallop, murmur, click, rub or other extra sounds. Skin:  Intact without suspicious lesions or rashes Cervical Nodes:  No lymphadenopathy noted Psych:  normal affect, talkative and pleasant    Impression & Recommendations:  Problem # 1:  CERUMEN IMPACTION, BILATERAL (ICD-380.4) Assessment Improved much improved after use of debrox at home and then simple ear irrigation hearing is imp in office  urged to continue debrox use occas  Problem # 2:  TOBACCO ABUSE (ICD-305.1) Assessment: Improved hopeful that chantix will work-- no cig since sat  urged to keep working on it  His updated medication  list for this problem includes:    Chantix 0.5 Mg Tabs (Varenicline tartrate) .Marland Kitchen... 1 by mouth two times a day to quit smoking (not picked up yet)  Problem # 3:  ERECTILE DYSFUNCTION, ORGANIC (ICD-607.84) Assessment: New I suspect this may be related to blood flow/ vasc dz per pt - not bad enough for work up right now  is not interested in med yet handout given from [facility]  Complete Medication List: 1)   Synthroid 125 Mcg Tabs (Levothyroxine sodium) .... Take one by mouth daily 2)  Adult Aspirin Low Strength 81 Mg Tbdp (Aspirin) .... One by mouth dialy 3)  Chantix 0.5 Mg Tabs (Varenicline tartrate) .Marland Kitchen.. 1 by mouth two times a day to quit smoking (not picked up yet) 4)  Ferrous Sulfate 325 (65 Fe) Mg Tabs (Ferrous sulfate) .... Take 1 tablet by mouth two times a day  Patient Instructions: 1)  ears look good after irrigation  2)  let me know if any symptoms at all    Orders Added: 1)  Est. Patient Level III [[address]]    Prior Medications: SYNTHROID 125 MCG TABS (LEVOTHYROXINE SODIUM) take one by mouth daily ADULT ASPIRIN LOW STRENGTH 81 MG TBDP (ASPIRIN) one by mouth dialy CHANTIX 0.5 MG TABS (VARENICLINE TARTRATE) 1 by mouth two times a day to quit smoking (NOT PICKED UP YET) FERROUS SULFATE 325 (65 FE) MG TABS (FERROUS SULFATE) Take 1 tablet by mouth two times a day Current Allergies: ! CHANTIX (VARENICLINE TARTRATE)

## 2010-11-17 NOTE — Assessment & Plan Note (Signed)
Summary: IRON DEF ANEMIA/HEMOGLOBIN 9.8/YF   History of Present Illness Visit Type: Initial Consult Primary GI MD: Stan Head MD [facility] Primary Provider: Roxy Manns, MD Requesting Provider: Roxy Manns, MD Chief Complaint: Patient here for further evaluation of anemia. He denies any symptoms with the exception of weight loss and loss of appetite. History of Present Illness:   + Early satiety over past year along with anorexia and weight loss. Hgb fell from 12.8 last year to 9 this year and ferritin is low. He denies any bleeding but had a trace heme + stool on DRE yesterday.    GI Review of Systems    Reports loss of appetite and  weight loss.   Weight loss of 10 pounds over 1 year.   Denies abdominal pain, acid reflux, belching, bloating, chest pain, dysphagia with liquids, dysphagia with solids, heartburn, nausea, vomiting, vomiting blood, and  weight gain.        Denies black tarry stools, change in bowel habit, constipation, diarrhea, diverticulosis, fecal incontinence, heme positive stool, hemorrhoids, irritable bowel syndrome, jaundice, light color stool, liver problems, rectal bleeding, and  rectal pain. Preventive Screening-Counseling & Management  Caffeine-Diet-Exercise     Does Patient Exercise: no      Drug Use:  no.      Clinical Reports Reviewed:  Colonoscopy:  08/20/2008:  Comments: 1) 5 MM RECTAL POLYP REMOVED 2) SIGMOID DIVERTICULOSIS 3) EXTERNAL HEMORRHOIDS 4) OTHERWISE NORMAL, EXCELLENT PREP 5) PRIOR ADENOMA OF COLON (2004)  ***MICROSCOPIC EXAMINATION AND DIAGNOSIS***   RECTUM, POLYP(S):  ADENOMATOUS POLYP. NO HIGH GRADE DYSPLASIA OR EVIDENCE OF MALIGNANCY IDENTIFIED (BIOPSY).    Colonoscopy  Procedure date:  08/20/2003  Findings:      8 mm rectal adenoma diverticulosis external hemorrhoids   Current Medications (verified): 1)  Synthroid 125 Mcg Tabs (Levothyroxine Sodium) .... Take One By Mouth Daily 2)  Adult Aspirin Low Strength 81 Mg  Tbdp (Aspirin) .... One By Mouth Dialy 3)  Chantix 0.5 Mg Tabs (Varenicline Tartrate) .Marland Kitchen.. 1 By Mouth Two Times A Day To Quit Smoking (Not Picked Up Yet)  Allergies (verified): 1)  ! Chantix (Varenicline Tartrate)  Past History:  Past Medical History: Reviewed history from 07/22/2008 and no changes required. Hypothyroidism CVA deg disk LS  smoker colon polyp  Past Surgical History: Eye surgery (2000)-Left eye Hernia surgery (1998) Rectal abscess , foreign body- surgery (10/2002) Carotid dopplers- neg (06/2003) Colonoscopy- diverticulosis, rectal polyp, hemorrhoids (08/2003) colonoscopy - diverticulosis and rectal polyp (11/09)- re check 5 y   surg Dr Carolynne Edouard, [facility]-- [facility]  Family History: Father: died age 73- CVA Mother: CAD Siblings: brother died from MI, sister with diab, HTN no cancer in family Family History of Diabetes: sister, daughter  Social History: Marital Status:divorced lives with girlfriend > 20 yrs Children: one daughter with epilepsy, DM. 1 son- healthy Occupation: retired smokes 1ppd trying to quit Alcohol Use - no Daily Caffeine Use-8 cups coffee daily Illicit Drug Use - no Patient does not get regular exercise.  Drug Use:  no Does Patient Exercise:  no  Review of Systems       The patient complains of back pain, cough, fatigue, hearing problems, shortness of breath, and vision changes.         All other ROS negative except as per HPI.   Vital Signs:  Patient profile:   77 year old male Height:      66.5 inches Weight:      144.25 pounds BMI:  23.02 BSA:     1.75 Pulse rate:   72 / minute Pulse rhythm:   regular BP sitting:   96 / 62  (left arm)  Vitals Entered By: Lamona Curl CMA Duncan Dull) (September 27, 2010 2:46 PM)  Physical Exam  General:  elderly NAD Eyes:  PERRLA, no icterus. Mouth:  pharynx pink and moist.   missing multiple teeth and remaining in poor repair Neck:  Supple; no masses or  thyromegaly. Lungs:  coarse breath sounds throughout Heart:  Regular rate and rhythm; no murmurs, rubs,  or bruits. Abdomen:  Bowel sounds positive,abdomen soft and non-tender without masses, organomegally  very slight bulge on valsalva in R  groin area is reducible and nontender Extremities:  No clubbing, cyanosis, edema or deformities noted. Cervical Nodes:  No significant cervical or supraclavicular adenopathy.  Psych:  Alert and cooperative. Normal mood and affect.   Impression & Recommendations:  Problem # 1:  ANEMIA, IRON DEFICIENCY (ICD-280.9) Assessment New need to look for UGI blood loss first as has had colonoscopies 2005 and 2009. overall situation with weight loss, anorexia and some early satiety suggestive of upper GI process which could include malignancy B12 is low normal  start Fe SO4  Problem # 2:  BLOOD IN STOOL, OCCULT (ICD-792.1) Assessment: New  Found on DRE if iFOBT is + would be specific for colon -  EGD now, further work-up depending upon what that shows  Orders: EGD (EGD)  Problem # 3:  WEIGHT LOSS (ICD-783.21) Assessment: Comment Only  10# in 1 ayear with some early satiety and anorexia in setting of new iron-deficinecy anemia await EGD if negative likely CT colonoscopy 2005 and 2009 so colon cancer unlikely though possible  Orders: EGD (EGD)  Patient Instructions: 1)  We will see you at your procedure on 10/20/10. 2)  Please begin Ferrous Sulfate 325mg  as directed below.  This is available over-the-counter. 3)   Endoscopy Center Patient Information Guide given to patient.  4)  Upper Endoscopy brochure given.  5)  The medication list was reviewed and reconciled.  All changed / newly prescribed medications were explained.  A complete medication list was provided to the patient / caregiver.

## 2010-11-17 NOTE — Letter (Signed)
Summary: Results Follow up Letter  [place] at [facility]  [address] [place], [place] [address]   Phone: [phone]  Fax: [phone]    10/03/2010 MRN: [id number]    [facility] [address] [place], [place]  [address]    Dear Mr. Quintela,  The following are the results of your recent test(s):  Test         Result    Pap Smear:        Normal _____  Not Normal _____ Comments: ______________________________________________________ Cholesterol: LDL(Bad cholesterol):         Your goal is less than:         HDL (Good cholesterol):       Your goal is more than: Comments:  ______________________________________________________ Mammogram:        Normal _____  Not Normal _____ Comments:  ___________________________________________________________________ Hemoccult:        Normal _____  Not normal _______ Comments:    _____________________________________________________________________ Other Tests:Stool card was normal.    We routinely do not discuss normal results over the telephone.  If you desire a copy of the results, or you have any questions about this information we can discuss them at your next office visit.   Sincerely,    Idamae Schuller Eileen Kangas,MD  MT/ri

## 2010-11-17 NOTE — Letter (Signed)
Summary: CMA Hemoccult Letter  [facility]  [address] [place], [place] [address]   Phone: [phone]  Fax: [phone]         October 25, 2010 MRN: [id number]    [facility] [address] [place], [place]  [address]    Dear Mr. Meiner,    Dr Leone Payor has requested that you do the home hemoccult cards after 11/16/10.  Please follow the instructions on the inside cover of the cards. Your health is very important to [place].These tests will help ensure that Dr.  Leone Payor has all the information at his disposal to make a complete diagnosis for you.  Thank you for your prompt attention to this matter.   Sincerely,    Darcey Nora RN, CGRN

## 2010-11-17 NOTE — Assessment & Plan Note (Signed)
Summary: B12 injection x 1 only/sheri  Nurse Visit   Allergies: 1)  ! Chantix (Varenicline Tartrate)  Medication Administration  Injection # 1:    Medication: Vit B12 1000 mcg    Diagnosis: ANEMIA, IRON DEFICIENCY (ICD-280.9)    Route: IM    Site: L deltoid    Exp Date: 07/2012    Lot #: [id number]    Mfr: American Regent    Patient tolerated injection without complications    Given by: Milford Cage NCMA (October 28, 2010 10:34 AM)  Orders Added: 1)  Vit B12 1000 mcg [J3420]

## 2010-11-17 NOTE — Procedures (Signed)
Summary: Upper Endoscopy  Patient: Nethan Caudillo Note: All result statuses are Final unless otherwise noted.  Tests: (1) Upper Endoscopy (EGD)   EGD Upper Endoscopy       DONE     [facility]     [address]     [place], [place]  [address]           ENDOSCOPY PROCEDURE REPORT           PATIENT:  Vinal, Rosengrant  MR#:  [id number]     BIRTHDATE:  [birth year], 77 yrs. old  GENDER:  male           ENDOSCOPIST:  Iva Boop, MD, [facility]           PROCEDURE DATE:  10/20/2010     PROCEDURE:  EGD with biopsy, 19147     ASA CLASS:  Class II     INDICATIONS:  anemia, hemoccult positive stool, early satiety,     weight loss + stool was on rectal exam, an iFOBT was negative           MEDICATIONS:   Fentanyl 25 mcg IV, Versed 2 mg IV     TOPICAL ANESTHETIC:  Exactacain Spray           DESCRIPTION OF PROCEDURE:   After the risks benefits and     alternatives of the procedure were thoroughly explained, informed     consent was obtained.  The LB [id number]-H180 [id number] endoscope was     introduced through the mouth and advanced to the second portion of     the duodenum, without limitations.  The instrument was slowly     withdrawn as the mucosa was fully examined.     <<PROCEDUREIMAGES>>           Moderate gastritis was found. Irregular mucosa, erythema in     pre-pyloric antrum. Multiple biopsies were obtained and sent to     pathology.  Otherwise the examination was normal. There was some     dark-colored material consistent with partially dissolved iron     tablet in the stomach.    Retroflexed views revealed no     abnormalities.    The scope was then withdrawn from the patient     and the procedure completed.           COMPLICATIONS:  None           ENDOSCOPIC IMPRESSION:     1) Moderate gastritis     2) Otherwise normal examination     RECOMMENDATIONS:     1) Await pathology results     2) He was anemic in 08/2009 and had his last colonoscopy in     2009. iFOBT is negative  and the heme + stool was guaiac and "trace     +".I do not think he needs another colonoscopy at this time.     3) His B12 level is low normal - consider at least 1 injection     pending pathology review           REPEAT EXAM:  In for as needed.           Iva Boop, MD, [facility]           CC:  Judy Pimple, MD     The Patient           n.     eSIGNED:   Iva Boop at 10/20/2010 03:07 PM  [place], [place], [id number]  Note: An exclamation mark (!) indicates a result that was not dispersed into the flowsheet. Document Creation Date: 10/20/2010 3:08 PM _______________________________________________________________________  (1) Order result status: Final Collection or observation date-time: 10/20/2010 14:55 Requested date-time:  Receipt date-time:  Reported date-time:  Referring Physician:   Ordering Physician: Stan Head [phone]) Specimen Source:  Source: Launa Grill Order Number: [id number] Lab site:

## 2010-11-24 ENCOUNTER — Other Ambulatory Visit: Payer: Self-pay | Admitting: Internal Medicine

## 2010-11-24 ENCOUNTER — Encounter (INDEPENDENT_AMBULATORY_CARE_PROVIDER_SITE_OTHER): Payer: Self-pay | Admitting: *Deleted

## 2010-11-24 ENCOUNTER — Telehealth: Payer: Self-pay | Admitting: Internal Medicine

## 2010-11-24 ENCOUNTER — Other Ambulatory Visit: Payer: Self-pay

## 2010-11-24 DIAGNOSIS — Z1289 Encounter for screening for malignant neoplasm of other sites: Secondary | ICD-10-CM

## 2010-11-24 LAB — HEMOCCULT SLIDES (X 3 CARDS)
OCCULT 1: NEGATIVE
OCCULT 3: NEGATIVE
OCCULT 4: NEGATIVE
OCCULT 5: NEGATIVE

## 2010-11-29 ENCOUNTER — Other Ambulatory Visit: Payer: Self-pay | Admitting: Internal Medicine

## 2010-11-29 ENCOUNTER — Encounter (INDEPENDENT_AMBULATORY_CARE_PROVIDER_SITE_OTHER): Payer: Self-pay | Admitting: *Deleted

## 2010-11-29 ENCOUNTER — Other Ambulatory Visit: Payer: MEDICARE

## 2010-11-29 DIAGNOSIS — D500 Iron deficiency anemia secondary to blood loss (chronic): Secondary | ICD-10-CM

## 2010-11-29 DIAGNOSIS — Z5111 Encounter for antineoplastic chemotherapy: Secondary | ICD-10-CM

## 2010-11-29 LAB — CBC WITH DIFFERENTIAL/PLATELET
Basophils Absolute: 0.1 10*3/uL (ref 0.0–0.1)
Eosinophils Absolute: 0.4 10*3/uL (ref 0.0–0.7)
Lymphocytes Relative: 28.1 % (ref 12.0–46.0)
MCHC: 34 g/dL (ref 30.0–36.0)
MCV: 83 fl (ref 78.0–100.0)
Monocytes Absolute: 0.6 10*3/uL (ref 0.1–1.0)
Neutrophils Relative %: 57.3 % (ref 43.0–77.0)
Platelets: 180 10*3/uL (ref 150.0–400.0)
RBC: 5.6 Mil/uL (ref 4.22–5.81)
RDW: 29.9 % — ABNORMAL HIGH (ref 11.5–14.6)

## 2010-11-29 LAB — VITAMIN B12: Vitamin B-12: 261 pg/mL (ref 211–911)

## 2010-11-29 LAB — FERRITIN: Ferritin: 22.2 ng/mL (ref 22.0–322.0)

## 2010-12-01 NOTE — Progress Notes (Signed)
Summary: hemoccults are negative - needs labs  Phone Note Outgoing Call   Summary of Call: No blood in stools Please clarify that he is still on iron (should be) and also have him get a CBC, ferritin and B12 level re: anemia Iva Boop MD, [facility]  November 24, 2010 1:48 PM   Follow-up for Phone Call        patient advised he will come for labs on Tuesday. Follow-up by: Darcey Nora RN, CGRN,  November 24, 2010 3:40 PM

## 2010-12-05 ENCOUNTER — Other Ambulatory Visit: Payer: Self-pay

## 2011-03-03 NOTE — Op Note (Signed)
Carl Mcconnell, Carl Mcconnell                             ACCOUNT NO.:  [id number]   MEDICAL RECORD NO.:  [id number]                   PATIENT TYPE:  INP   LOCATION:  0455                                 FACILITY:  [facility]   PHYSICIAN:  Velora Heckler, M.D.                DATE OF BIRTH:  [birth year]   DATE OF PROCEDURE:  11/07/2002  DATE OF DISCHARGE:                                 OPERATIVE REPORT   PREOPERATIVE DIAGNOSES:  1. Foreign body in rectum with perforation.  2. Perirectal abscess.  3. Perineal cellulitis.   POSTOPERATIVE DIAGNOSES:  1. Foreign body in rectum with perforation.  2. Perirectal abscess.  3. Perineal cellulitis.   PROCEDURE:  1. Exam under anesthesia.  2. Extraction foreign body from rectum.  3. Incision and drainage of perirectal abscess with open packing.   SURGEON:  Velora Heckler, M.D.   ANESTHESIA:  Spinal per Dr. Shireen Quan.   ESTIMATED BLOOD LOSS:  Minimal.   PREPARATION:  Betadine.   COMPLICATIONS:  None.   INDICATIONS FOR PROCEDURE:  The patient is a 77 year old white male referred  from [facility] with perirectal abscess. On physical exam,  a foreign body is palpable within the rectum.   FINDINGS:  Probable small 3.5 cm chicken bone in the distal rectum with  perforation of the right rectal wall and resultant right perirectal abscess.   DESCRIPTION OF PROCEDURE:  The procedure was done in OR #1 at the [facility]. The patient was brought to the operating room,  placed in a supine position on the operating room table following placement  of spinal anesthetic by Dr. Helane Rima. The patient is placed in  lithotomy. The perineum is prepped and draped in the usual strict aseptic  fashion. A digital rectal exam was performed and a foreign body is again  palpable approximately 3-4 cm past the anal verge. Using an anal speculum,  the foreign body is visualized. It is extracted with a Kelly clamp. It  measures  approximately 3 1/2 cm in size and likely represents a small thin  chicken bone. Palpation of the rectum reveals no other abnormality except  for an ulceration lesion in the right rectal wall which appears to be full  thickness and likely showing the point of perforation. On the medial right  buttock is an ulcerated area that is a point at which the perirectal abscess  has necessitated through the skin. This has opened for 1 cm medially and  laterally. Digital palpation reveals multiloculated abscess cavity which  extends into the low pelvis. Loculations are broken up and the cavity is  irrigated copiously with warm saline. The cavity is then packed with a one  inch vaginal pack soaked with Betadine. The 4 x 4s and ABD pads are placed  on the perineum as dressing. The patient is taken out of lithotomy and  brought to the recovery room in stable condition. The patient tolerated the  procedure well.                                               Velora Heckler, M.D.    TMG/MEDQ  D:  11/07/2002  T:  11/07/2002  Job:  [id number]   cc:   Marne A. Tower, M.D. [facility]  [address] [place]  [place] [address]  Fax: [phone]

## 2011-03-03 NOTE — Discharge Summary (Signed)
Carl Mcconnell, Carl Mcconnell                             ACCOUNT NO.:  [id number]   MEDICAL RECORD NO.:  [id number]                   PATIENT TYPE:  INP   LOCATION:  0455                                 FACILITY:  [facility]   PHYSICIAN:  Velora Heckler, M.D.                DATE OF BIRTH:  16-Oct-1933   DATE OF ADMISSION:  11/07/2002  DATE OF DISCHARGE:  11/14/2002                                 DISCHARGE SUMMARY   REASON FOR ADMISSION:  Perirectal abscess, foreign body in rectum, perineal  cellulitis.   BRIEF HISTORY:  The patient is a pleasant 77 year old white male from  [facility] who presents at the request of Dr. Roxy Manns at [facility] for perirectal abscess with drainage.  The patient was  evaluated in the office by Dr. Chevis Pretty.  He found a foreign body in the  rectum.  As I was the physician on call I took the patient under my care and  went to the operating room for exploration and evaluation.   HOSPITAL COURSE:  The patient was immediately admitted to [facility] and taken directly to the operating room on 11/07/2002.  He underwent examination under anesthesia with extraction of a foreign body  from the rectum.  This appeared to be some type of a chicken bone.  The  patient had incision and drainage of perirectal abscess with open packing.  Postoperatively, the patient did well.  He received intravenous antibiotics.  The packing was removed on the second postoperative day.  The patient was  treated for hypokalemia.  The patient's diet was advanced.  Fevers resolved.  Persistent leukocytosis resolved with continued administration of  intravenous antibiotics.  The patient was taken back to the operating room  on 11/12/2002 for repeat examination under anesthesia, endorectal  ultrasound, and debridement and pulse lavage of the perirectal abscess  cavity.  Postoperatively, the patient continued to make good progress and  was prepared for  discharge home when his white count returned to normal at  9600 on 11/14/2002.   DISCHARGE PLANNING:  The patient is discharged home 11/14/2002 in good  condition, tolerating a regular diet, and ambulating independently.  Discharge medications include Cipro, Vicodin, and home medications as per  usual.  The patient will return to see Dr. Chevis Pretty in the office at  [facility] in 1 week.   FINAL DIAGNOSES:  1. Perirectal abscess.  2. Foreign body in rectum.  3. Perineal cellulitis.   CONDITION AT DISCHARGE:  Improved.                                               Velora Heckler, M.D.    TMG/MEDQ  D:  01/20/2003  T:  01/20/2003  Job:  [id number]   cc:   Marne A. Milinda Antis, M.D. [facility]

## 2011-03-03 NOTE — Op Note (Signed)
   Carl Mcconnell, Carl Mcconnell                             ACCOUNT NO.:  [id number]   MEDICAL RECORD NO.:  [id number]                   PATIENT TYPE:  INP   LOCATION:  0455                                 FACILITY:  [facility]   PHYSICIAN:  Ollen Gross. Vernell Morgans, M.D.              DATE OF BIRTH:  Mar 22, 1933   DATE OF PROCEDURE:  11/12/2002  DATE OF DISCHARGE:  11/14/2002                                 OPERATIVE REPORT   PREOPERATIVE DIAGNOSIS:  Perirectal abscess.   POSTOPERATIVE DIAGNOSIS:  Perirectal abscess.   PROCEDURE:  I&D of perirectal abscess.   SURGEON:  Ollen Gross. Carolynne Edouard, M.D.   ANESTHESIA:  Spinal.   DESCRIPTION OF PROCEDURE:  After informed consent was obtained, the patient  was brought to the operating room and placed in the supine position on the  operating room table.  After adequate induction of spinal anesthesia, the  patient was placed in the lithotomy position, and his perirectal area was  prepped with Betadine and draped in the usual sterile manner.  The patient  had already had one previous debridement a couple of days prior.  The  perirectal area was opened, but there was still a lot of foul drainage  coming from the area.  The cavity tracked both anteriorly and posteriorly  when probed with the finger, and these tracts were opened sharply with a  Bovie electrocautery through the skin and subcutaneous tissue until the  tracts were more widely opened.  Some loculations deep in the cavity were  broken up, and the wound was pulse lavaged with saline.  The wound was much  cleaner by the end of the case and was packed with moist Kerlix, and then  sterile dressings were applied.  The patient tolerated the procedure well.  At the end of the case, all needle, sponge, and instrument counts were  correct.  The patient was then awakened and taken to the recovery room in  stable condition.                                               Ollen Gross. Vernell Morgans, M.D.    PST/MEDQ  D:  11/26/2002   T:  11/26/2002  Job:  [id number]

## 2011-10-17 HISTORY — PX: CT PERC CHOLECYSTOSTOMY: HXRAD817

## 2011-11-08 ENCOUNTER — Other Ambulatory Visit: Payer: Self-pay | Admitting: Family Medicine

## 2011-11-08 NOTE — Telephone Encounter (Signed)
[facility] request refill Synthroid 125 mcg. Pt last seen 10/18/10. Pt does not have future appt scheduled.Please advise.

## 2011-11-08 NOTE — Telephone Encounter (Signed)
I will refil times one electronically Please sched appt

## 2011-11-09 NOTE — Telephone Encounter (Signed)
Spoke with pt and scheduled fu

## 2011-11-20 ENCOUNTER — Encounter: Payer: Self-pay | Admitting: Family Medicine

## 2011-11-21 ENCOUNTER — Ambulatory Visit (INDEPENDENT_AMBULATORY_CARE_PROVIDER_SITE_OTHER): Payer: Medicare Other | Admitting: Family Medicine

## 2011-11-21 ENCOUNTER — Encounter: Payer: Self-pay | Admitting: Family Medicine

## 2011-11-21 VITALS — BP 130/80 | HR 76 | Temp 97.9°F | Ht 66.5 in | Wt 154.8 lb

## 2011-11-21 DIAGNOSIS — E039 Hypothyroidism, unspecified: Secondary | ICD-10-CM

## 2011-11-21 DIAGNOSIS — D649 Anemia, unspecified: Secondary | ICD-10-CM

## 2011-11-21 DIAGNOSIS — R030 Elevated blood-pressure reading, without diagnosis of hypertension: Secondary | ICD-10-CM | POA: Insufficient documentation

## 2011-11-21 DIAGNOSIS — F17200 Nicotine dependence, unspecified, uncomplicated: Secondary | ICD-10-CM

## 2011-11-21 LAB — CBC WITH DIFFERENTIAL/PLATELET
Basophils Absolute: 0.1 10*3/uL (ref 0.0–0.1)
Hemoglobin: 14.8 g/dL (ref 13.0–17.0)
Lymphocytes Relative: 24.6 % (ref 12.0–46.0)
Monocytes Relative: 8.7 % (ref 3.0–12.0)
Neutro Abs: 5.8 10*3/uL (ref 1.4–7.7)
Platelets: 247 10*3/uL (ref 150.0–400.0)
RDW: 14.7 % — ABNORMAL HIGH (ref 11.5–14.6)
WBC: 9.2 10*3/uL (ref 4.5–10.5)

## 2011-11-21 MED ORDER — LEVOTHYROXINE SODIUM 125 MCG PO TABS: 125.0000 ug | ORAL_TABLET | Freq: Every day | ORAL | Status: DC

## 2011-11-21 NOTE — Patient Instructions (Addendum)
If you are interested in a shingles/zoster vaccine - call your insurance to check on coverage,( you should not get it within 1 month of other vaccines) , then call us for a prescription  for it to take to a pharmacy that gives the shot or get it here Labs today  Try hard to keep cutting down smoking with intent to quit

## 2011-11-21 NOTE — Assessment & Plan Note (Signed)
Disc in detail risks of smoking and possible outcomes including copd, vascular/ heart disease, cancer , respiratory and sinus infections  Pt voices understanding Pt cutting down but not ready to quit yet Given literature on tips to quit and dangers of smoking

## 2011-11-21 NOTE — Assessment & Plan Note (Signed)
bp high on first check today- is concerning in light of hx of cva No symptoms at all Labs today 2nd check better  Disc imp of smoking cessation and salt avoidance

## 2011-11-21 NOTE — Assessment & Plan Note (Signed)
No symptoms - last tsh stable Re check today- if no changes will continue current dose Nl exam

## 2011-11-21 NOTE — Assessment & Plan Note (Signed)
This was resolved last time- re check today  Stays away from [facility] due to  ? GI bleeding in past

## 2011-11-21 NOTE — Progress Notes (Signed)
Subjective:    Patient ID: Carl Mcconnell, male    DOB: [birth year], 78 y.o.   MRN: [id number]  HPI Here for f/u of chronic issues incl hypothyroidism and smoking also incl hx of cva and anemia  Has been doing fairly well  Cool weather is keeping him in - lot to do outside    bp is high today 156/88 No other checks  No headache or edema or other symptoms    Wt is up 6 lb with bmi of 24  Flu shot- got one in the fall  Had a bad virus recently -- with cough and n/v/d -- is finally over that  Off the cold meds now   Lab Results  Component Value Date   WBC 7.2 11/29/2010   HGB 15.8 11/29/2010   HCT 46.5 11/29/2010   MCV 83.0 11/29/2010   PLT 180.0 11/29/2010   this was nl a year ago  Hypothyroid  Lab Results  Component Value Date   TSH 0.65 09/20/2010    Clinically - feels the same No change in energy or hair/ skin etc   No stroke symptoms at all   Smoking - has cut back No longer smokes in the house  Smokes in am with coffee  Is smoking about 10 cig per day (down from a pack)  Does want to cut down but not to quit Understands the risks   No new cough or sob or wheeze   Patient Active Problem List  Diagnoses  . HYPOTHYROIDISM  . UNSPECIFIED ANEMIA  . TOBACCO ABUSE  . C V A/STROKE  . COPD  . SEBACEOUS CYST  . DEGENERATIVE DISC DISEASE, LUMBAR SPINE  . WEIGHT LOSS  . ANEMIA, IRON DEFICIENCY  . CERUMEN IMPACTION, BILATERAL  . INGUINAL HERNIA  . ERECTILE DYSFUNCTION, ORGANIC  . BLOOD IN STOOL, OCCULT  . COLONIC POLYPS, ADENOMATOUS, HX OF  . Elevated blood pressure (not hypertension)   Past Medical History  Diagnosis Date  . Hypothyroidism   . Stroke   . Degenerative disc disease     LS  . Colon polyp   . Anemia   . ED (erectile dysfunction)    Past Surgical History  Procedure Date  . Hernia repair 1998  . Eye surgery 2000    left eye  . Rectal abscess 10/2002    foreign body   History  Substance Use Topics  . Smoking status: Current Everyday  Smoker  . Smokeless tobacco: Not on file  . Alcohol Use: No   Family History  Problem Relation Age of Onset  . Heart disease Mother     CAD  . Stroke Father   . Diabetes Sister   . Hypertension Sister   . Heart disease Brother     MI   Allergies  Allergen Reactions  . Varenicline Tartrate     REACTION: ---in HIGH dosage (patient can take lower dose) Caused eyes out of focus, h/a,nausea and achiness   Current Outpatient Prescriptions on File Prior to Visit  Medication Sig Dispense Refill  . aspirin 81 MG tablet Take 81 mg by mouth daily.       . Cyanocobalamin (VITAMIN B 12 PO) Take 1 tablet by mouth daily.      . ferrous sulfate 325 (65 FE) MG tablet Take 325 mg by mouth 2 (two) times daily.               Review of Systems Review of Systems  Constitutional: Negative for fever,  appetite change, fatigue and unexpected weight change.  Eyes: Negative for pain and visual disturbance.  Respiratory: Negative for cough and shortness of breath.   Cardiovascular: Negative for cp or palpitations    Gastrointestinal: Negative for nausea, diarrhea and constipation.  Genitourinary: Negative for urgency and frequency.  Skin: Negative for pallor or rash   Neurological: Negative for weakness, light-headedness, numbness and headaches.  Hematological: Negative for adenopathy. Does not bruise/bleed easily.  Psychiatric/Behavioral: Negative for dysphoric mood. The patient is not nervous/anxious.          Objective:   Physical Exam  Constitutional: He appears well-developed and well-nourished. No distress.  HENT:  Head: Normocephalic and atraumatic.  Mouth/Throat: Oropharynx is clear and moist.  Eyes: Conjunctivae and EOM are normal. Pupils are equal, round, and reactive to light. No scleral icterus.  Neck: Normal range of motion. Neck supple. No JVD present. Carotid bruit is not present. No thyromegaly present.  Cardiovascular: Normal rate, regular rhythm, normal heart sounds and  intact distal pulses.  Exam reveals no gallop.   Pulmonary/Chest: Effort normal and breath sounds normal. No respiratory distress. He has no wheezes.       Diffusely distant bs   Abdominal: Soft. Bowel sounds are normal. He exhibits no distension, no abdominal bruit and no mass. There is no tenderness.  Musculoskeletal: Normal range of motion. He exhibits no edema and no tenderness.  Lymphadenopathy:    He has no cervical adenopathy.  Neurological: He is alert. He has normal reflexes. No cranial nerve deficit. He exhibits normal muscle tone. Coordination normal.  Skin: Skin is warm and dry. No rash noted. No erythema. No pallor.       Dry skin   Psychiatric: He has a normal mood and affect.          Assessment & Plan:

## 2011-11-22 LAB — COMPREHENSIVE METABOLIC PANEL
ALT: 13 U/L (ref 0–53)
CO2: 31 mEq/L (ref 19–32)
Calcium: 9.4 mg/dL (ref 8.4–10.5)
Chloride: 105 mEq/L (ref 96–112)
GFR: 59.6 mL/min — ABNORMAL LOW (ref >60.00)
Sodium: 144 mEq/L (ref 135–145)
Total Protein: 8 g/dL (ref 6.0–8.3)

## 2011-11-22 LAB — LIPID PANEL: Total CHOL/HDL Ratio: 3

## 2011-11-23 ENCOUNTER — Other Ambulatory Visit: Payer: Self-pay | Admitting: Family Medicine

## 2011-11-23 ENCOUNTER — Telehealth: Payer: Self-pay

## 2011-11-23 DIAGNOSIS — E039 Hypothyroidism, unspecified: Secondary | ICD-10-CM

## 2011-11-23 MED ORDER — LEVOTHYROXINE SODIUM 150 MCG PO TABS: 150.0000 ug | ORAL_TABLET | Freq: Every day | ORAL | Status: DC

## 2011-11-23 NOTE — Telephone Encounter (Signed)
Levothyroxine added to pt med list and phoned to [facility].

## 2011-11-23 NOTE — Progress Notes (Signed)
Addended by: Patience Musca on: 11/23/2011 11:57 AM   Modules accepted: Orders

## 2011-11-23 NOTE — Telephone Encounter (Signed)
Message copied by Patience Musca on Thu Nov 23, 2011 11:57 AM ------      Message from: Roxy Manns A      Created: Thu Nov 23, 2011  9:59 AM       K is high - ? If this is real or if possible lab error... Is he taking any otc vitamins with K in them ? Any herbs, etc that I am not aware of       Also tsh high so I need to change his thyroid dose from 125 to 150 mcg daily- please call in #[phone] with 3 refils       Re check tsh in 6 weeks please for hypothyroid      Cholesterol is up just a bit

## 2011-11-27 ENCOUNTER — Other Ambulatory Visit: Payer: Self-pay | Admitting: Family Medicine

## 2011-11-27 DIAGNOSIS — E875 Hyperkalemia: Secondary | ICD-10-CM

## 2011-11-28 ENCOUNTER — Other Ambulatory Visit (INDEPENDENT_AMBULATORY_CARE_PROVIDER_SITE_OTHER): Payer: Medicare Other

## 2011-11-28 DIAGNOSIS — E875 Hyperkalemia: Secondary | ICD-10-CM

## 2012-01-08 ENCOUNTER — Other Ambulatory Visit (INDEPENDENT_AMBULATORY_CARE_PROVIDER_SITE_OTHER): Payer: Medicare Other

## 2012-01-08 DIAGNOSIS — E039 Hypothyroidism, unspecified: Secondary | ICD-10-CM

## 2012-01-11 ENCOUNTER — Encounter: Payer: Self-pay | Admitting: *Deleted

## 2012-03-27 ENCOUNTER — Other Ambulatory Visit: Payer: Self-pay | Admitting: Family Medicine

## 2012-04-24 ENCOUNTER — Other Ambulatory Visit: Payer: Self-pay | Admitting: *Deleted

## 2012-04-24 MED ORDER — LEVOTHYROXINE SODIUM 150 MCG PO TABS: ORAL_TABLET | ORAL | Status: DC

## 2012-05-24 ENCOUNTER — Telehealth: Payer: Self-pay | Admitting: Family Medicine

## 2012-05-24 NOTE — Telephone Encounter (Signed)
Caller: June/Friend; PCP: Tower, Marne A.;;  Call regarding worked in yard on 05/23/12 and he pulled down some poison ivy . Woke up today and eyelids swollen with yellowish/whitish matter on lashes. Eyes feel itchy and weepy. No further discharge. No blisters yet. Washed eyes with baby shampoo. Triage and Care advice per [facility] and Eye Irritation Protocols and appnt advised within 4 hours for "involveds eyes, mouth or genitals". NO APPNTS AVAILABLE FOR 05/24/12-PLEASE CALL BACK TO LET THEM KNOW IF HE NEEDS TO BE SEEN IN [facility].  CB#: [phone]

## 2012-05-24 NOTE — Telephone Encounter (Signed)
Patient informed. 

## 2012-05-24 NOTE — Telephone Encounter (Signed)
Tell him to go to [facility] if symptoms are not improved tonight -otherwise please schedule him for a sat clinic appt thanks

## 2012-08-22 ENCOUNTER — Ambulatory Visit (INDEPENDENT_AMBULATORY_CARE_PROVIDER_SITE_OTHER): Payer: Medicare Other

## 2012-08-22 DIAGNOSIS — Z23 Encounter for immunization: Secondary | ICD-10-CM

## 2012-09-13 ENCOUNTER — Inpatient Hospital Stay (HOSPITAL_COMMUNITY)
Admission: EM | Admit: 2012-09-13 | Discharge: 2012-09-16 | DRG: 446 | Disposition: A | Discharge status: Home / Self Care | Payer: Medicare Other | Attending: General Surgery | Admitting: General Surgery

## 2012-09-13 ENCOUNTER — Ambulatory Visit (INDEPENDENT_AMBULATORY_CARE_PROVIDER_SITE_OTHER): Payer: Medicare Other | Admitting: Family Medicine

## 2012-09-13 ENCOUNTER — Other Ambulatory Visit (HOSPITAL_COMMUNITY): Payer: Medicare Other

## 2012-09-13 ENCOUNTER — Ambulatory Visit (HOSPITAL_COMMUNITY)
Admission: RE | Admit: 2012-09-13 | Discharge: 2012-09-13 | Disposition: A | Discharge status: Home / Self Care | Payer: Medicare Other | Source: Ambulatory Visit | Attending: Family Medicine | Admitting: Family Medicine

## 2012-09-13 ENCOUNTER — Encounter (HOSPITAL_COMMUNITY): Payer: Self-pay | Admitting: *Deleted

## 2012-09-13 ENCOUNTER — Encounter: Payer: Self-pay | Admitting: Family Medicine

## 2012-09-13 VITALS — BP 126/82 | HR 84 | Temp 98.0°F | Wt 144.8 lb

## 2012-09-13 DIAGNOSIS — J4489 Other specified chronic obstructive pulmonary disease: Secondary | ICD-10-CM | POA: Diagnosis present

## 2012-09-13 DIAGNOSIS — Z888 Allergy status to other drugs, medicaments and biological substances status: Secondary | ICD-10-CM

## 2012-09-13 DIAGNOSIS — D649 Anemia, unspecified: Secondary | ICD-10-CM | POA: Diagnosis present

## 2012-09-13 DIAGNOSIS — J449 Chronic obstructive pulmonary disease, unspecified: Secondary | ICD-10-CM | POA: Diagnosis present

## 2012-09-13 DIAGNOSIS — K810 Acute cholecystitis: Secondary | ICD-10-CM

## 2012-09-13 DIAGNOSIS — Z23 Encounter for immunization: Secondary | ICD-10-CM

## 2012-09-13 DIAGNOSIS — K5730 Diverticulosis of large intestine without perforation or abscess without bleeding: Secondary | ICD-10-CM | POA: Insufficient documentation

## 2012-09-13 DIAGNOSIS — R1900 Intra-abdominal and pelvic swelling, mass and lump, unspecified site: Secondary | ICD-10-CM

## 2012-09-13 DIAGNOSIS — M51379 Other intervertebral disc degeneration, lumbosacral region without mention of lumbar back pain or lower extremity pain: Secondary | ICD-10-CM | POA: Diagnosis present

## 2012-09-13 DIAGNOSIS — Z860100 Personal history of colon polyps, unspecified: Secondary | ICD-10-CM

## 2012-09-13 DIAGNOSIS — K5900 Constipation, unspecified: Secondary | ICD-10-CM | POA: Diagnosis present

## 2012-09-13 DIAGNOSIS — R109 Unspecified abdominal pain: Secondary | ICD-10-CM

## 2012-09-13 DIAGNOSIS — K819 Cholecystitis, unspecified: Secondary | ICD-10-CM

## 2012-09-13 DIAGNOSIS — E039 Hypothyroidism, unspecified: Secondary | ICD-10-CM | POA: Diagnosis present

## 2012-09-13 DIAGNOSIS — Z8673 Personal history of transient ischemic attack (TIA), and cerebral infarction without residual deficits: Secondary | ICD-10-CM

## 2012-09-13 DIAGNOSIS — M5137 Other intervertebral disc degeneration, lumbosacral region: Secondary | ICD-10-CM | POA: Diagnosis present

## 2012-09-13 DIAGNOSIS — F17200 Nicotine dependence, unspecified, uncomplicated: Secondary | ICD-10-CM | POA: Diagnosis present

## 2012-09-13 DIAGNOSIS — Z7982 Long term (current) use of aspirin: Secondary | ICD-10-CM

## 2012-09-13 DIAGNOSIS — K812 Acute cholecystitis with chronic cholecystitis: Principal | ICD-10-CM | POA: Diagnosis present

## 2012-09-13 DIAGNOSIS — K7689 Other specified diseases of liver: Secondary | ICD-10-CM | POA: Insufficient documentation

## 2012-09-13 DIAGNOSIS — Z8249 Family history of ischemic heart disease and other diseases of the circulatory system: Secondary | ICD-10-CM

## 2012-09-13 HISTORY — DX: Chronic obstructive pulmonary disease, unspecified: J44.9

## 2012-09-13 LAB — CBC WITH DIFFERENTIAL/PLATELET
Basophils Relative: 0 % (ref 0–1)
Eosinophils Absolute: 0.5 10*3/uL (ref 0.0–0.7)
Eosinophils Relative: 5 % (ref 0–5)
Hemoglobin: 12.3 g/dL — ABNORMAL LOW (ref 13.0–17.0)
Lymphs Abs: 1.9 10*3/uL (ref 0.7–4.0)
MCH: 27.4 pg (ref 26.0–34.0)
MCHC: 32.4 g/dL (ref 30.0–36.0)
MCV: 84.6 fL (ref 78.0–100.0)
Monocytes Absolute: 1 10*3/uL (ref 0.1–1.0)
Monocytes Relative: 10 % (ref 3–12)
RBC: 4.49 MIL/uL (ref 4.22–5.81)

## 2012-09-13 LAB — HEPATIC FUNCTION PANEL
AST: 17 U/L (ref 0–37)
Albumin: 3.2 g/dL — ABNORMAL LOW (ref 3.5–5.2)
Alkaline Phosphatase: 85 U/L (ref 39–117)
Bilirubin, Direct: 0 mg/dL (ref 0.0–0.3)
Total Protein: 7.4 g/dL (ref 6.0–8.3)

## 2012-09-13 LAB — POCT URINALYSIS DIPSTICK
Ketones, UA: NEGATIVE
Leukocytes, UA: NEGATIVE
Urobilinogen, UA: 0.2

## 2012-09-13 LAB — BASIC METABOLIC PANEL
Calcium: 8.9 mg/dL (ref 8.4–10.5)
GFR: 60.59 mL/min (ref >60.00)
Sodium: 137 mEq/L (ref 135–145)

## 2012-09-13 NOTE — Progress Notes (Signed)
Nature conservation officer at [facility] [address] [place] [address] Phone: [phone] Fax: [phone]  Date:  09/13/2012   Name:  Carl Mcconnell   DOB:  1932-11-02   MRN:  [id number] Gender: male Age: 78 y.o.  PCP:  Roxy Manns, MD  Evaluating MD: Hannah Beat, MD   Chief Complaint: Abdominal Pain   History of Present Illness:  KO BARDON is a 78 y.o. pleasant patient who presents with the following:  Pt with a history of COPD, CVA, presents with acute on chronic abdominal pain with worsening pain for 1 week, AF, but minimally able to eat for > 1 week. He was able to eat some thanksgiving dinner yesterday. Global pain, but mostly on the right side. He feels like he can feel a knot on the right side.   S/p L sided hernia repair in the past. He reports having intermittent abdominal pain for 2 years, but this has been worsening and he and his wife think he is much different from baseline.  Pain all the way across his lower abdomen, and then can feel a hard dot on the right side and feel a dot, and has some pain on the right side. Has not really eaten much in a week or so. Complaints x 1 week.  Patient Active Problem List  Diagnosis  . HYPOTHYROIDISM  . UNSPECIFIED ANEMIA  . TOBACCO ABUSE  . C V A/STROKE  . COPD  . SEBACEOUS CYST  . DEGENERATIVE DISC DISEASE, LUMBAR SPINE  . ANEMIA, IRON DEFICIENCY  . CERUMEN IMPACTION, BILATERAL  . INGUINAL HERNIA  . ERECTILE DYSFUNCTION, ORGANIC  . BLOOD IN STOOL, OCCULT  . COLONIC POLYPS, ADENOMATOUS, HX OF  . Elevated blood pressure (not hypertension)    Past Medical History  Diagnosis Date  . Hypothyroidism   . Stroke   . Degenerative disc disease     LS  . Colon polyp   . Anemia   . ED (erectile dysfunction)     Past Surgical History  Procedure Date  . Hernia repair 1998  . Eye surgery 2000    left eye  . Rectal abscess 10/2002    foreign body    History  Substance Use Topics  . Smoking status:  Current Every Day Smoker  . Smokeless tobacco: Not on file  . Alcohol Use: No    Family History  Problem Relation Age of Onset  . Heart disease Mother     CAD  . Stroke Father   . Diabetes Sister   . Hypertension Sister   . Heart disease Brother     MI    Allergies  Allergen Reactions  . Varenicline Tartrate     REACTION: ---in HIGH dosage (patient can take lower dose) Caused eyes out of focus, h/a,nausea and achiness    Medication list has been reviewed and updated.  Outpatient Prescriptions Prior to Visit  Medication Sig Dispense Refill  . aspirin 81 MG tablet Take 81 mg by mouth daily.       . Cyanocobalamin (VITAMIN B 12 PO) Take 1 tablet by mouth daily.      Marland Kitchen levothyroxine (SYNTHROID, LEVOTHROID) 150 MCG tablet Take one by mouth daily  90 tablet  2  . [DISCONTINUED] ferrous sulfate 325 (65 FE) MG tablet Take 325 mg by mouth 2 (two) times daily.       Last reviewed on 09/13/2012 11:59 AM by Shon Millet, CMA  Review of Systems:  No fever,  decreased appetite, no vomitting, no diarrhea. No CP, no SOB.  RECENT R SHOULDER PAIN IN THE LAST 2 DAYS Otherwise, the pertinent positives and negatives are listed above and in the HPI, otherwise a full review of systems has been reviewed and is negative unless noted positive.   Physical Examination: Filed Vitals:   09/13/12 1154  BP: 126/82  Pulse: 84  Temp: 98 F (36.7 C)  TempSrc: Oral  Weight: 144 lb 12 oz (65.658 kg)    There is no height on file to calculate BMI. Ideal Body Weight:    GEN: WDWN, NAD, Non-toxic, A & O x 3 HEENT: Atraumatic, Normocephalic. Neck supple. No masses, No LAD. Ears and Nose: No external deformity. CV: RRR, No M/G/R. No JVD. No thrill. No extra heart sounds. PULM: CTA B, no wheezes, crackles, rhonchi. No retractions. No resp. distress. No accessory muscle use. ABD: markedly tender in the R UQ and more laterally with guarding. + rebound, ? Mass on the right side laterally, nt in  the lower quadrants. + BS. ND EXTR: No c/c/e NEURO Normal gait.  PSYCH: Normally interactive. Conversant. Not depressed or anxious appearing.  Calm demeanor.   Objective data:  Results for orders placed in visit on 09/13/12  POCT URINALYSIS DIPSTICK      Component Value Range   Color, UA yellow     Clarity, UA clear     Glucose, UA neg.     Bilirubin, UA neg.     Ketones, UA neg.     Spec Grav, UA 1.025     Blood, UA Trace     pH, UA 6.0     Protein, UA Trace     Urobilinogen, UA 0.2     Nitrite, UA neg.     Leukocytes, UA Negative    BASIC METABOLIC PANEL      Component Value Range   Sodium 137  135 - 145 mEq/L   Potassium 4.2  3.5 - 5.1 mEq/L   Chloride 99  96 - 112 mEq/L   CO2 30  19 - 32 mEq/L   Glucose, Bld 95  70 - 99 mg/dL   BUN 18  6 - 23 mg/dL   Creatinine, Ser 1.2  0.4 - 1.5 mg/dL   Calcium 8.9  8.4 - 11.9 mg/dL   GFR 14.78  >29.56 mL/min  HEPATIC FUNCTION PANEL      Component Value Range   Total Bilirubin 0.3  0.3 - 1.2 mg/dL   Bilirubin, Direct 0.0  0.0 - 0.3 mg/dL   Alkaline Phosphatase 85  39 - 117 U/L   AST 17  0 - 37 U/L   ALT 10  0 - 53 U/L   Total Protein 7.4  6.0 - 8.3 g/dL   Albumin 3.2 (*) 3.5 - 5.2 g/dL  LIPASE      Component Value Range   Lipase 21.0  11.0 - 59.0 U/L    Ct Abdomen Pelvis W Contrast  09/13/2012  *RADIOLOGY REPORT*  Clinical Data: Right-sided abdominal pain and palpable abnormality.  CT ABDOMEN AND PELVIS WITH CONTRAST  Technique:  Multidetector CT imaging of the abdomen and pelvis was performed following the standard protocol during bolus administration of intravenous contrast.  Contrast:  100 ml Omnipaque-300 and oral contrast  Comparison: None.  Findings: The gallbladder is distended and shows diffuse wall thickening, with mild pericholecystic soft tissue stranding.  This is consistent with acute cholecystitis.  Adjacent hyperemia is seen involving the hepatic parenchyma. There is  no evidence of biliary ductal dilatation.   Several subtle hypervascular lesions each measuring approximately 1.5 cm are seen in both the right and left hepatic lobes.  These are nonspecific.  The pancreas, spleen, adrenal glands, and kidneys are unremarkable in appearance.  No evidence of hydronephrosis.  No evidence of lymphadenopathy.  A normal appendix is visualized.  Diverticulosis is seen involving the sigmoid colon, however there is no evidence of diverticulitis. No evidence of dilated bowel loops or hernia.  IMPRESSION:  1.  Findings consistent with acute cholecystitis. 2.  No evidence of biliary ductal dilatation or appendicitis. 3.   Diverticulosis.  No radiographic evidence of diverticulitis. 4.  Several small indeterminate hypervascular liver lesions. Non- emergent abdomen MRI without and with contrast is recommended for further characterization.   Original Report Authenticated By: Myles Rosenthal, M.D.     Assessment and Plan:  1. Acute cholecystitis    2. Abdominal pain  Urinalysis Dipstick, Basic metabolic panel, Hepatic function panel, Lipase, CT Abdomen Pelvis W Contrast, CT Abdomen Pelvis W Contrast  3. Abdominal mass  CT Abdomen Pelvis W Contrast    Acute cholecystitis:  Obtain a CT of the abdomen and pelvis with contrast to rule out gallbladder pathology, SBO, ruptured bowel, or other acute intraabdominal process. At this time, CT has returned and the patient has acute cholecystitis.  Discussed this on the phone with the patient's wife and I spoke to CT and ER MD. He has been taken to ER for general surgery consultation. Wife understands.   Also with liver lesions noted - will cc: Dr. Milinda Antis, will need f/u MRI of the abdomen when stable post-op/hosp.  Orders Today:  Orders Placed This Encounter  Procedures  . CT Abdomen Pelvis W Contrast    Standing Status: Future     Number of Occurrences: 1     Standing Expiration Date: 12/14/2013    EPIC ORDER/ BUN- CREAT- DRAWN 09/13/12-WILL CALL/WT-[id number]/NOT DIAB/NKDA TO CT CONTRAST  PER OFFICE/INS-BLUE MC/CLC/HEATHER/ PT P/U DRINK IN OFFICE    Order Specific Question:  Preferred imaging location?    Answer:  [id number]    Order Specific Question:  Reason for exam:    Answer:  abd pain, severe. mass felt R  . Basic metabolic panel  . Hepatic function panel  . Lipase  . Urinalysis Dipstick    Updated Medication List: (Includes new medications, updates to list, dose adjustments) No orders of the defined types were placed in this encounter.    Medications Discontinued: Medications Discontinued During This Encounter  Medication Reason  . ferrous sulfate 325 (65 FE) MG tablet Patient Preference     Hannah Beat, MD

## 2012-09-13 NOTE — ED Notes (Signed)
Poor appetite for last week

## 2012-09-13 NOTE — ED Notes (Signed)
The pt has already had a c-t scan of the abd  And rt iv a-c from the c-t scan

## 2012-09-13 NOTE — H&P (Signed)
Carl Mcconnell is an 78 y.o. male.   Chief Complaint: abdominal pain HPI: 1 week history of RUQ pain.  Has history of intermittent RUQ pain in the  Past.  Severe now but been hurting for last 7 days,  No nausea or vomiting.  No fever or chills.  Past Medical History  Diagnosis Date  . Hypothyroidism   . Stroke   . Degenerative disc disease     LS  . Colon polyp   . Anemia   . ED (erectile dysfunction)     Past Surgical History  Procedure Date  . Hernia repair 1998  . Eye surgery 2000    left eye  . Rectal abscess 10/2002    foreign body    Family History  Problem Relation Age of Onset  . Heart disease Mother     CAD  . Stroke Father   . Diabetes Sister   . Hypertension Sister   . Heart disease Brother     MI   Social History:  reports that he has been smoking.  He does not have any smokeless tobacco history on file. He reports that he does not drink alcohol or use illicit drugs.  Allergies:  Allergies  Allergen Reactions  . Varenicline Tartrate     REACTION: ---in HIGH dosage (patient can take lower dose) Caused eyes out of focus, h/a,nausea and achiness     (Not in a hospital admission)  Results for orders placed during the hospital encounter of 09/13/12 (from the past 48 hour(s))  CBC WITH DIFFERENTIAL     Status: Abnormal   Collection Time   09/13/12  8:10 PM      Component Value Range Comment   WBC 10.2  4.0 - 10.5 K/uL    RBC 4.49  4.22 - 5.81 MIL/uL    Hemoglobin 12.3 (*) 13.0 - 17.0 g/dL    HCT 91.4 (*) 78.2 - 52.0 %    MCV 84.6  78.0 - 100.0 fL    MCH 27.4  26.0 - 34.0 pg    MCHC 32.4  30.0 - 36.0 g/dL    RDW 95.6  21.3 - 08.6 %    Platelets 217  150 - 400 K/uL    Neutrophils Relative 66  43 - 77 %    Neutro Abs 6.7  1.7 - 7.7 K/uL    Lymphocytes Relative 19  12 - 46 %    Lymphs Abs 1.9  0.7 - 4.0 K/uL    Monocytes Relative 10  3 - 12 %    Monocytes Absolute 1.0  0.1 - 1.0 K/uL    Eosinophils Relative 5  0 - 5 %    Eosinophils Absolute 0.5   0.0 - 0.7 K/uL    Basophils Relative 0  0 - 1 %    Basophils Absolute 0.0  0.0 - 0.1 K/uL    Ct Abdomen Pelvis W Contrast  09/13/2012  *RADIOLOGY REPORT*  Clinical Data: Right-sided abdominal pain and palpable abnormality.  CT ABDOMEN AND PELVIS WITH CONTRAST  Technique:  Multidetector CT imaging of the abdomen and pelvis was performed following the standard protocol during bolus administration of intravenous contrast.  Contrast:  100 ml Omnipaque-300 and oral contrast  Comparison: None.  Findings: The gallbladder is distended and shows diffuse wall thickening, with mild pericholecystic soft tissue stranding.  This is consistent with acute cholecystitis.  Adjacent hyperemia is seen involving the hepatic parenchyma. There is no evidence of biliary ductal dilatation.  Several subtle  hypervascular lesions each measuring approximately 1.5 cm are seen in both the right and left hepatic lobes.  These are nonspecific.  The pancreas, spleen, adrenal glands, and kidneys are unremarkable in appearance.  No evidence of hydronephrosis.  No evidence of lymphadenopathy.  A normal appendix is visualized.  Diverticulosis is seen involving the sigmoid colon, however there is no evidence of diverticulitis. No evidence of dilated bowel loops or hernia.  IMPRESSION:  1.  Findings consistent with acute cholecystitis. 2.  No evidence of biliary ductal dilatation or appendicitis. 3.   Diverticulosis.  No radiographic evidence of diverticulitis. 4.  Several small indeterminate hypervascular liver lesions. Non- emergent abdomen MRI without and with contrast is recommended for further characterization.   Original Report Authenticated By: Myles Rosenthal, M.D.     Review of Systems  Constitutional: Negative for fever and chills.  HENT: Negative.   Eyes: Positive for double vision.  Respiratory: Positive for wheezing.   Cardiovascular: Negative.   Gastrointestinal: Positive for abdominal pain.  Genitourinary: Negative.     Musculoskeletal: Negative.   Skin: Negative.   Neurological: Negative.   Endo/Heme/Allergies: Negative.   Psychiatric/Behavioral: Negative.     Blood pressure 145/73, pulse 55, temperature 97.8 F (36.6 C), resp. rate 20, SpO2 97.00%. Physical Exam  Constitutional: He is oriented to person, place, and time. He appears well-developed and well-nourished.  HENT:  Head: Normocephalic and atraumatic.  Eyes: Pupils are equal, round, and reactive to light. No scleral icterus.  Neck: Normal range of motion. Neck supple.  Cardiovascular: Normal rate and regular rhythm.   Respiratory: Effort normal. He has wheezes.  GI: He exhibits mass. There is tenderness in the right upper quadrant. There is guarding and positive Murphy's sign.    Musculoskeletal: Normal range of motion.  Neurological: He is alert and oriented to person, place, and time.  Skin: Skin is warm and dry.  Psychiatric: He has a normal mood and affect. His behavior is normal. Judgment and thought content normal.     Assessment/Plan Acute on chronic cholecystitis COPD Hx of CVA  With 1 week history of pain and a palpable gallbladder,  Percutaneous drainage and delayed cholecystectomy may be best.  Admit for now for IVF,  ABX and probable percutaneous drain by IR Saturday.  Discussed with wife and patient.  Marwah Disbro A. 09/13/2012, 11:22 PM

## 2012-09-13 NOTE — ED Provider Notes (Signed)
History     CSN: [id number]  Arrival date & time 09/13/12  1701   First MD Initiated Contact with Patient 09/13/12 1929      Chief Complaint  Patient presents with  . Abdominal Pain    (Consider location/radiation/quality/duration/timing/severity/associated sxs/prior treatment) Patient is a 78 y.o. male presenting with abdominal pain. The history is provided by the patient. No language interpreter was used.  Abdominal Pain The primary symptoms of the illness include abdominal pain and fatigue. The primary symptoms of the illness do not include fever, nausea, vomiting or diarrhea. The current episode started more than 2 days ago. The onset of the illness was gradual. The problem has been gradually worsening.  The patient has had a change in bowel habit. Additional symptoms associated with the illness include constipation.  Patient seen at his PCP today, sent for evaluation of peri-umbilical abdominal pain.  Patient denies N/V.  Recent laxative use for mild constipation.  No increase in pain with eating.  No reported fevers.  Pain seems to worsen when patient gets into supine position, described as an increasing pressure to periumbilical area.  Small palpable nodule in RUQ.  Past Medical History  Diagnosis Date  . Hypothyroidism   . Stroke   . Degenerative disc disease     LS  . Colon polyp   . Anemia   . ED (erectile dysfunction)     Past Surgical History  Procedure Date  . Hernia repair 1998  . Eye surgery 2000    left eye  . Rectal abscess 10/2002    foreign body    Family History  Problem Relation Age of Onset  . Heart disease Mother     CAD  . Stroke Father   . Diabetes Sister   . Hypertension Sister   . Heart disease Brother     MI    History  Substance Use Topics  . Smoking status: Current Every Day Smoker  . Smokeless tobacco: Not on file  . Alcohol Use: No      Review of Systems  Constitutional: Positive for appetite change and fatigue. Negative  for fever.  Gastrointestinal: Positive for abdominal pain and constipation. Negative for nausea, vomiting and diarrhea.  All other systems reviewed and are negative.    Allergies  Varenicline tartrate  Home Medications   Current Outpatient Rx  Name  Route  Sig  Dispense  Refill  . ACETAMINOPHEN 325 MG PO TABS   Oral   Take 650 mg by mouth every 6 (six) hours as needed. Pain/fever         . ASPIRIN 81 MG PO TABS   Oral   Take 81 mg by mouth daily.          Marland Kitchen VITAMIN B 12 PO   Oral   Take 1 tablet by mouth daily.         Marland Kitchen LEVOTHYROXINE SODIUM 150 MCG PO TABS      Take one by mouth daily   90 tablet   2     BP 145/73  Pulse 55  Temp 97.8 F (36.6 C)  Resp 20  SpO2 97%  Physical Exam  Nursing note and vitals reviewed. Constitutional: He is oriented to person, place, and time. He appears well-developed.  HENT:  Head: Normocephalic.  Eyes: Conjunctivae normal are normal. Pupils are equal, round, and reactive to light.  Neck: Normal range of motion. Neck supple.  Cardiovascular: Normal rate, regular rhythm and normal heart sounds.  Pulmonary/Chest: Effort normal and breath sounds normal.  Abdominal: Soft. Bowel sounds are normal. There is tenderness. There is no rebound and no guarding.  Musculoskeletal: Normal range of motion.  Neurological: He is alert and oriented to person, place, and time.  Skin: Skin is warm and dry.  Psychiatric: He has a normal mood and affect. His behavior is normal. Judgment and thought content normal.    ED Course  Procedures (including critical care time)   Labs Reviewed  CBC WITH DIFFERENTIAL   Ct Abdomen Pelvis W Contrast  09/13/2012  *RADIOLOGY REPORT*  Clinical Data: Right-sided abdominal pain and palpable abnormality.  CT ABDOMEN AND PELVIS WITH CONTRAST  Technique:  Multidetector CT imaging of the abdomen and pelvis was performed following the standard protocol during bolus administration of intravenous contrast.   Contrast:  100 ml Omnipaque-300 and oral contrast  Comparison: None.  Findings: The gallbladder is distended and shows diffuse wall thickening, with mild pericholecystic soft tissue stranding.  This is consistent with acute cholecystitis.  Adjacent hyperemia is seen involving the hepatic parenchyma. There is no evidence of biliary ductal dilatation.  Several subtle hypervascular lesions each measuring approximately 1.5 cm are seen in both the right and left hepatic lobes.  These are nonspecific.  The pancreas, spleen, adrenal glands, and kidneys are unremarkable in appearance.  No evidence of hydronephrosis.  No evidence of lymphadenopathy.  A normal appendix is visualized.  Diverticulosis is seen involving the sigmoid colon, however there is no evidence of diverticulitis. No evidence of dilated bowel loops or hernia.  IMPRESSION:  1.  Findings consistent with acute cholecystitis. 2.  No evidence of biliary ductal dilatation or appendicitis. 3.   Diverticulosis.  No radiographic evidence of diverticulitis. 4.  Several small indeterminate hypervascular liver lesions. Non- emergent abdomen MRI without and with contrast is recommended for further characterization.   Original Report Authenticated By: Myles Rosenthal, M.D.      No diagnosis found.    MDM  Per PMD note from today:    Assessment and Plan:  1.  Acute cholecystitis    2.  Abdominal pain  Urinalysis Dipstick, Basic metabolic panel, Hepatic function panel, Lipase, CT Abdomen Pelvis W Contrast, CT Abdomen Pelvis W Contrast   3.  Abdominal mass  CT Abdomen Pelvis W Contrast   Acute cholecystitis:  Obtain a CT of the abdomen and pelvis with contrast to rule out gallbladder pathology, SBO, ruptured bowel, or other acute intraabdominal process. At this time, CT has returned and the patient has acute cholecystitis.  Discussed this on the phone with the patient's wife and I spoke to CT and ER MD. He has been taken to ER for general surgery consultation.  Wife understands.  Also with liver lesions noted - will cc: Dr. Milinda Antis, will need f/u MRI of the abdomen when stable post-op/hosp.  LFT's WNL from office labs.   CBC pending--will contact general surgery once it results.  9:16 PM CCS to see patient.       Jimmye Norman, NP 09/13/12 [phone]

## 2012-09-13 NOTE — ED Notes (Signed)
The pt has rlq pain for one week.  He saw his doctor this am and he was told to come here .  No nv or diarrhea

## 2012-09-13 NOTE — ED Notes (Signed)
Sprite with cup of ice provided to pt.

## 2012-09-13 NOTE — Patient Instructions (Addendum)
REFERRAL: GO THE THE FRONT ROOM AT THE ENTRANCE OF OUR CLINIC, NEAR CHECK IN. ASK FOR Carl Mcconnell. SHE WILL HELP YOU SET UP YOUR REFERRAL. DATE: TIME:  

## 2012-09-14 ENCOUNTER — Inpatient Hospital Stay (HOSPITAL_COMMUNITY): Payer: Medicare Other

## 2012-09-14 ENCOUNTER — Encounter (HOSPITAL_COMMUNITY): Payer: Self-pay | Admitting: *Deleted

## 2012-09-14 DIAGNOSIS — K810 Acute cholecystitis: Secondary | ICD-10-CM

## 2012-09-14 LAB — CBC
HCT: 39.8 % (ref 39.0–52.0)
Hemoglobin: 13.1 g/dL (ref 13.0–17.0)
MCH: 28 pg (ref 26.0–34.0)
MCV: 85 fL (ref 78.0–100.0)
RBC: 4.68 MIL/uL (ref 4.22–5.81)
WBC: 9.8 10*3/uL (ref 4.0–10.5)

## 2012-09-14 LAB — COMPREHENSIVE METABOLIC PANEL
AST: 15 U/L (ref 0–37)
Albumin: 2.8 g/dL — ABNORMAL LOW (ref 3.5–5.2)
BUN: 12 mg/dL (ref 6–23)
Calcium: 9.1 mg/dL (ref 8.4–10.5)
Chloride: 99 mEq/L (ref 96–112)
Creatinine, Ser: 1.19 mg/dL (ref 0.50–1.35)
Total Protein: 6.9 g/dL (ref 6.0–8.3)

## 2012-09-14 LAB — PROTIME-INR: INR: 1.08 (ref 0.00–1.49)

## 2012-09-14 MED ORDER — MIDAZOLAM HCL 2 MG/2ML IJ SOLN
INTRAMUSCULAR | Status: AC
  Filled 2012-09-14: qty 4

## 2012-09-14 MED ORDER — BIOTENE DRY MOUTH MT LIQD
15.0000 mL | Freq: Two times a day (BID) | OROMUCOSAL | Status: DC
  Administered 2012-09-14 (×2): 15 mL via OROMUCOSAL

## 2012-09-14 MED ORDER — MIDAZOLAM HCL 2 MG/2ML IJ SOLN
INTRAMUSCULAR | Status: AC | PRN
  Administered 2012-09-14: 0.5 mg via INTRAVENOUS
  Administered 2012-09-14: 1 mg via INTRAVENOUS
  Administered 2012-09-14: 0.5 mg via INTRAVENOUS

## 2012-09-14 MED ORDER — CHLORHEXIDINE GLUCONATE 0.12 % MT SOLN
15.0000 mL | Freq: Two times a day (BID) | OROMUCOSAL | Status: DC
  Administered 2012-09-14 (×2): 15 mL via OROMUCOSAL
  Filled 2012-09-14 (×2): qty 15

## 2012-09-14 MED ORDER — LEVOTHYROXINE SODIUM 75 MCG PO TABS
75.0000 ug | ORAL_TABLET | Freq: Every day | ORAL | Status: DC
  Administered 2012-09-14: 75 ug via ORAL
  Filled 2012-09-14 (×3): qty 1

## 2012-09-14 MED ORDER — ONDANSETRON HCL 4 MG/2ML IJ SOLN: 4.0000 mg | Freq: Four times a day (QID) | INTRAMUSCULAR | Status: DC | PRN

## 2012-09-14 MED ORDER — IOHEXOL 300 MG/ML  SOLN
50.0000 mL | Freq: Once | INTRAMUSCULAR | Status: AC | PRN
  Administered 2012-09-14: 15 mL

## 2012-09-14 MED ORDER — HYDROMORPHONE HCL PF 1 MG/ML IJ SOLN
1.0000 mg | INTRAMUSCULAR | Status: DC | PRN
  Administered 2012-09-14 – 2012-09-15 (×2): 1 mg via INTRAVENOUS
  Filled 2012-09-14 (×2): qty 1

## 2012-09-14 MED ORDER — FENTANYL CITRATE 0.05 MG/ML IJ SOLN
INTRAMUSCULAR | Status: AC | PRN
  Administered 2012-09-14: 25 ug via INTRAVENOUS
  Administered 2012-09-14: 50 ug via INTRAVENOUS
  Administered 2012-09-14: 25 ug via INTRAVENOUS

## 2012-09-14 MED ORDER — PNEUMOCOCCAL VAC POLYVALENT 25 MCG/0.5ML IJ INJ
0.5000 mL | INJECTION | INTRAMUSCULAR | Status: AC
  Administered 2012-09-15: 0.5 mL via INTRAMUSCULAR
  Filled 2012-09-14: qty 0.5

## 2012-09-14 MED ORDER — CIPROFLOXACIN IN D5W 400 MG/200ML IV SOLN
400.0000 mg | Freq: Two times a day (BID) | INTRAVENOUS | Status: DC
  Administered 2012-09-14 – 2012-09-16 (×5): 400 mg via INTRAVENOUS
  Filled 2012-09-14 (×6): qty 200

## 2012-09-14 MED ORDER — DEXTROSE-NACL 5-0.9 % IV SOLN
INTRAVENOUS | Status: DC
  Administered 2012-09-14 – 2012-09-16 (×5): via INTRAVENOUS

## 2012-09-14 MED ORDER — FENTANYL CITRATE 0.05 MG/ML IJ SOLN
INTRAMUSCULAR | Status: AC
  Filled 2012-09-14: qty 4

## 2012-09-14 NOTE — Procedures (Signed)
Interventional Radiology Procedure Note  Procedure: Placement of percutaneous, transhepatic 29F cholecystostomy tube.  Aspiration of 60 mL purulent bile.  Complications:  None immediate Recommendations: - Maintain to bulb suction until normalization of leukocytosis and LFTs - Once acute phase is over (24-72 hrs), may transition to gravity bag drainage - Pt to undergo interval cholecystectomy in ~6-10 weeks at surgeons discretion - Routine tube check and change in 4-6 weeks  Signed,  Sterling Big, MD Vascular & Interventional Radiologist [facility]

## 2012-09-14 NOTE — Progress Notes (Signed)
Received patient from ED via stretcher. Pt ambulated to bed. Oriented pt to call bell and room. VSS. Will monitor.

## 2012-09-14 NOTE — Progress Notes (Signed)
Patient ID: Carl Mcconnell, male   DOB: Feb 26, 1933, 78 y.o.   MRN: [id number] Request received for placement of percutaneous cholecystostomy tube on pt with hx of acute on chronic cholecystitis and palpable gallbladder. Imaging studies reviewed by Dr. Archer Asa. Additional PMH as below. Exam: pt awake/alert; chest- distant BS with few scatt insp/exp wheezes; heart- RRR; abd- soft, +BS, tender RUQ, palpable GB.      Filed Vitals:   09/13/12 1720 09/13/12 2355 09/14/12 0038 09/14/12 0536  BP: 145/73 117/73 123/76 119/70  Pulse: 55 90 79 79  Temp: 97.8 F (36.6 C) 98.7 F (37.1 C) 98 F (36.7 C) 98.7 F (37.1 C)  TempSrc:  Oral Oral Oral  Resp: 20 22 18 18   Height:  5' 6.53" (1.69 m)    Weight:  144 lb 13.5 oz (65.7 kg)    SpO2: 97% 93% 96% 97%   Past Medical History  Diagnosis Date  . Hypothyroidism   . Stroke   . Degenerative disc disease     LS  . Colon polyp   . Anemia   . ED (erectile dysfunction)   . COPD (chronic obstructive pulmonary disease)    Past Surgical History  Procedure Date  . Hernia repair 1998  . Eye surgery 2000    left eye  . Rectal abscess 10/2002    foreign body   Ct Abdomen Pelvis W Contrast  09/13/2012  *RADIOLOGY REPORT*  Clinical Data: Right-sided abdominal pain and palpable abnormality.  CT ABDOMEN AND PELVIS WITH CONTRAST  Technique:  Multidetector CT imaging of the abdomen and pelvis was performed following the standard protocol during bolus administration of intravenous contrast.  Contrast:  100 ml Omnipaque-300 and oral contrast  Comparison: None.  Findings: The gallbladder is distended and shows diffuse wall thickening, with mild pericholecystic soft tissue stranding.  This is consistent with acute cholecystitis.  Adjacent hyperemia is seen involving the hepatic parenchyma. There is no evidence of biliary ductal dilatation.  Several subtle hypervascular lesions each measuring approximately 1.5 cm are seen in both the right and left hepatic lobes.   These are nonspecific.  The pancreas, spleen, adrenal glands, and kidneys are unremarkable in appearance.  No evidence of hydronephrosis.  No evidence of lymphadenopathy.  A normal appendix is visualized.  Diverticulosis is seen involving the sigmoid colon, however there is no evidence of diverticulitis. No evidence of dilated bowel loops or hernia.  IMPRESSION:  1.  Findings consistent with acute cholecystitis. 2.  No evidence of biliary ductal dilatation or appendicitis. 3.   Diverticulosis.  No radiographic evidence of diverticulitis. 4.  Several small indeterminate hypervascular liver lesions. Non- emergent abdomen MRI without and with contrast is recommended for further characterization.   Original Report Authenticated By: Myles Rosenthal, M.D.   Results for orders placed during the hospital encounter of 09/13/12  CBC WITH DIFFERENTIAL      Component Value Range   WBC 10.2  4.0 - 10.5 K/uL   RBC 4.49  4.22 - 5.81 MIL/uL   Hemoglobin 12.3 (*) 13.0 - 17.0 g/dL   HCT 47.8 (*) 29.5 - 62.1 %   MCV 84.6  78.0 - 100.0 fL   MCH 27.4  26.0 - 34.0 pg   MCHC 32.4  30.0 - 36.0 g/dL   RDW 30.8  65.7 - 84.6 %   Platelets 217  150 - 400 K/uL   Neutrophils Relative 66  43 - 77 %   Neutro Abs 6.7  1.7 - 7.7 K/uL  Lymphocytes Relative 19  12 - 46 %   Lymphs Abs 1.9  0.7 - 4.0 K/uL   Monocytes Relative 10  3 - 12 %   Monocytes Absolute 1.0  0.1 - 1.0 K/uL   Eosinophils Relative 5  0 - 5 %   Eosinophils Absolute 0.5  0.0 - 0.7 K/uL   Basophils Relative 0  0 - 1 %   Basophils Absolute 0.0  0.0 - 0.1 K/uL  COMPREHENSIVE METABOLIC PANEL      Component Value Range   Sodium 137  135 - 145 mEq/L   Potassium 4.1  3.5 - 5.1 mEq/L   Chloride 99  96 - 112 mEq/L   CO2 31  19 - 32 mEq/L   Glucose, Bld 108 (*) 70 - 99 mg/dL   BUN 12  6 - 23 mg/dL   Creatinine, Ser 0.86  0.50 - 1.35 mg/dL   Calcium 9.1  8.4 - 57.8 mg/dL   Total Protein 6.9  6.0 - 8.3 g/dL   Albumin 2.8 (*) 3.5 - 5.2 g/dL   AST 15  0 - 37 U/L    ALT 10  0 - 53 U/L   Alkaline Phosphatase 88  39 - 117 U/L   Total Bilirubin 0.4  0.3 - 1.2 mg/dL   GFR calc non Af Amer 57 (*) >90 mL/min   GFR calc Af Amer 66 (*) >90 mL/min  CBC      Component Value Range   WBC 9.8  4.0 - 10.5 K/uL   RBC 4.68  4.22 - 5.81 MIL/uL   Hemoglobin 13.1  13.0 - 17.0 g/dL   HCT 46.9  62.9 - 52.8 %   MCV 85.0  78.0 - 100.0 fL   MCH 28.0  26.0 - 34.0 pg   MCHC 32.9  30.0 - 36.0 g/dL   RDW 41.3  24.4 - 01.0 %   Platelets 238  150 - 400 K/uL  APTT      Component Value Range   aPTT 38 (*) 24 - 37 seconds  PROTIME-INR      Component Value Range   Prothrombin Time 13.9  11.6 - 15.2 seconds   INR 1.08  0.00 - 1.49   A/P: Pt with acute on chronic cholecystitis, palpable GB. Plan is for percutaneous cholecystostomy today. Details/risks of procedure d/w pt/wife with their understanding and consent.

## 2012-09-14 NOTE — Progress Notes (Signed)
Agree with PA note.    Signed,  Kawehi Hostetter K. Adalee Kathan, MD Vascular & Interventional Radiologist [facility]  

## 2012-09-14 NOTE — Progress Notes (Signed)
Subjective: Didn't sleep well. HA. No n/v. Still sore on rt  Objective: Vital signs in last 24 hours: Temp:  [97.8 F (36.6 C)-98.7 F (37.1 C)] 98.7 F (37.1 C) (11/30 0536) Pulse Rate:  [55-90] 79  (11/30 0536) Resp:  [18-22] 18  (11/30 0536) BP: (117-145)/(70-82) 119/70 mmHg (11/30 0536) SpO2:  [93 %-97 %] 97 % (11/30 0536) Weight:  [144 lb 12 oz (65.658 kg)-144 lb 13.5 oz (65.7 kg)] 144 lb 13.5 oz (65.7 kg) (11/29 2355) Last BM Date: 09/13/12  Intake/Output from previous day: 11/29 0701 - 11/30 0700 In: 725 [I.V.:525; IV Piggyback:200] Out: 500 [Urine:500] Intake/Output this shift:    Alert, nad cta ant Soft, flat; +RUQ TTP, no peritonitis No edema  Lab Results:   [facility] 09/14/12 0644 09/13/12 2010  WBC 9.8 10.2  HGB 13.1 12.3*  HCT 39.8 38.0*  PLT 238 217   BMET  Basename 09/14/12 0644 09/13/12 1313  NA 137 137  K 4.1 4.2  CL 99 99  CO2 31 30  GLUCOSE 108* 95  BUN 12 18  CREATININE 1.19 1.2  CALCIUM 9.1 8.9   PT/INR  Basename 09/14/12 0644  LABPROT 13.9  INR 1.08   ABG No results found for this basename: PHART:2,PCO2:2,PO2:2,HCO3:2 in the last 72 hours  Studies/Results: Ct Abdomen Pelvis W Contrast  09/13/2012  *RADIOLOGY REPORT*  Clinical Data: Right-sided abdominal pain and palpable abnormality.  CT ABDOMEN AND PELVIS WITH CONTRAST  Technique:  Multidetector CT imaging of the abdomen and pelvis was performed following the standard protocol during bolus administration of intravenous contrast.  Contrast:  100 ml Omnipaque-300 and oral contrast  Comparison: None.  Findings: The gallbladder is distended and shows diffuse wall thickening, with mild pericholecystic soft tissue stranding.  This is consistent with acute cholecystitis.  Adjacent hyperemia is seen involving the hepatic parenchyma. There is no evidence of biliary ductal dilatation.  Several subtle hypervascular lesions each measuring approximately 1.5 cm are seen in both the right and  left hepatic lobes.  These are nonspecific.  The pancreas, spleen, adrenal glands, and kidneys are unremarkable in appearance.  No evidence of hydronephrosis.  No evidence of lymphadenopathy.  A normal appendix is visualized.  Diverticulosis is seen involving the sigmoid colon, however there is no evidence of diverticulitis. No evidence of dilated bowel loops or hernia.  IMPRESSION:  1.  Findings consistent with acute cholecystitis. 2.  No evidence of biliary ductal dilatation or appendicitis. 3.   Diverticulosis.  No radiographic evidence of diverticulitis. 4.  Several small indeterminate hypervascular liver lesions. Non- emergent abdomen MRI without and with contrast is recommended for further characterization.   Original Report Authenticated By: Myles Rosenthal, M.D.     Anti-infectives: Anti-infectives     Start     Dose/Rate Route Frequency Ordered Stop   09/14/12 0100   ciprofloxacin (CIPRO) IVPB 400 mg        400 mg 200 mL/hr over 60 Minutes Intravenous Every 12 hours 09/14/12 0033            Assessment/Plan: Acute cholecystitis - given duration of symptoms and palpable gallbladder operative risk is too high for injury to surrounding viscera and CBD, discussed percutaneous cholecystostomy tube with IR - plan to do late this am. NPO til after procedure, cont IV abx  Hypothyroid - cont home med VTE prophy - scds for now; hold lovenox since getting invasive procedure today  Mary Sella. Andrey Campanile, MD, FACS General, Bariatric, & Minimally Invasive Surgery [facility], [place]  LOS: 1 day    Atilano Ina 09/14/2012

## 2012-09-15 LAB — CBC
HCT: 39.1 % (ref 39.0–52.0)
MCH: 28.2 pg (ref 26.0–34.0)
MCV: 84.8 fL (ref 78.0–100.0)
Platelets: 245 10*3/uL (ref 150–400)
RBC: 4.61 MIL/uL (ref 4.22–5.81)

## 2012-09-15 LAB — COMPREHENSIVE METABOLIC PANEL
AST: 17 U/L (ref 0–37)
Albumin: 2.6 g/dL — ABNORMAL LOW (ref 3.5–5.2)
Chloride: 103 mEq/L (ref 96–112)
Creatinine, Ser: 1.02 mg/dL (ref 0.50–1.35)
Potassium: 3.5 mEq/L (ref 3.5–5.1)
Total Bilirubin: 0.3 mg/dL (ref 0.3–1.2)
Total Protein: 6.4 g/dL (ref 6.0–8.3)

## 2012-09-15 MED ORDER — LEVOTHYROXINE SODIUM 75 MCG PO TABS
75.0000 ug | ORAL_TABLET | Freq: Every day | ORAL | Status: DC
  Administered 2012-09-15 – 2012-09-16 (×2): 75 ug via ORAL
  Filled 2012-09-15 (×2): qty 1

## 2012-09-15 MED ORDER — HYDROCODONE-ACETAMINOPHEN 5-325 MG PO TABS: 1.0000 | ORAL_TABLET | ORAL | Status: DC | PRN

## 2012-09-15 NOTE — Progress Notes (Signed)
Subjective: Pt doing fairly well; no nausea/vomiting or chills; has some mild soreness at RUQ drain site as expected  Objective: Vital signs in last 24 hours: Temp:  [97.3 F (36.3 C)-98.5 F (36.9 C)] 98.4 F (36.9 C) (12/01 0623) Pulse Rate:  [68-87] 74  (12/01 0623) Resp:  [15-25] 18  (12/01 0623) BP: (114-133)/(51-90) 114/63 mmHg (12/01 0623) SpO2:  [92 %-99 %] 94 % (12/01 0623) Last BM Date: 09/13/12  Intake/Output from previous day: 11/30 0701 - 12/01 0700 In: 2595 [P.O.:120; I.V.:2460] Out: 870 [Urine:625; Drains:245] Intake/Output this shift: Total I/O In: 240 [P.O.:240] Out: -   RUQ/GB drain intact, output 245 cc's blood-tinged bile, cx's pend  Lab Results:   [facility] 09/15/12 0620 09/14/12 0644  WBC 7.5 9.8  HGB 13.0 13.1  HCT 39.1 39.8  PLT 245 238   BMET  Basename 09/15/12 0620 09/14/12 0644  NA 139 137  K 3.5 4.1  CL 103 99  CO2 28 31  GLUCOSE 124* 108*  BUN 7 12  CREATININE 1.02 1.19  CALCIUM 8.7 9.1   PT/INR  Basename 09/14/12 0644  LABPROT 13.9  INR 1.08   Results for orders placed during the hospital encounter of 09/13/12  CULTURE, ROUTINE-ABSCESS     Status: Normal (Preliminary result)   Collection Time   09/14/12 12:08 PM      Component Value Range Status Comment   Specimen Description ABSCESS BILE   Final    Special Requests NONE   Final    Gram Stain     Final    Value: MODERATE WBC PRESENT, PREDOMINANTLY PMN     NO SQUAMOUS EPITHELIAL CELLS SEEN     NO ORGANISMS SEEN   Culture NO GROWTH 1 DAY   Final    Report Status PENDING   Incomplete     ABG No results found for this basename: PHART:2,PCO2:2,PO2:2,HCO3:2 in the last 72 hours  Studies/Results: Ct Abdomen Pelvis W Contrast  09/13/2012  *RADIOLOGY REPORT*  Clinical Data: Right-sided abdominal pain and palpable abnormality.  CT ABDOMEN AND PELVIS WITH CONTRAST  Technique:  Multidetector CT imaging of the abdomen and pelvis was performed following the standard protocol  during bolus administration of intravenous contrast.  Contrast:  100 ml Omnipaque-300 and oral contrast  Comparison: None.  Findings: The gallbladder is distended and shows diffuse wall thickening, with mild pericholecystic soft tissue stranding.  This is consistent with acute cholecystitis.  Adjacent hyperemia is seen involving the hepatic parenchyma. There is no evidence of biliary ductal dilatation.  Several subtle hypervascular lesions each measuring approximately 1.5 cm are seen in both the right and left hepatic lobes.  These are nonspecific.  The pancreas, spleen, adrenal glands, and kidneys are unremarkable in appearance.  No evidence of hydronephrosis.  No evidence of lymphadenopathy.  A normal appendix is visualized.  Diverticulosis is seen involving the sigmoid colon, however there is no evidence of diverticulitis. No evidence of dilated bowel loops or hernia.  IMPRESSION:  1.  Findings consistent with acute cholecystitis. 2.  No evidence of biliary ductal dilatation or appendicitis. 3.   Diverticulosis.  No radiographic evidence of diverticulitis. 4.  Several small indeterminate hypervascular liver lesions. Non- emergent abdomen MRI without and with contrast is recommended for further characterization.   Original Report Authenticated By: Myles Rosenthal, M.D.    Ir Perc Cholecystostomy  09/14/2012  *RADIOLOGY REPORT*  IR PERCUTANEOUS CHOLECYSTOSTOMY  Date: 09/14/2012  Clinical History: 78 year old male with a history of acute cholecystitis.  His symptoms and  an ongoing for a week and he is therefore a relatively high risk surgical candidate for bile duct injury.  Percutaneous cholecystostomy tube will be placed to facilitate drainage of the infected bile.  Once the acute inflammation has not decreased, the patient will undergo interval cholecystectomy at the discretion of the surgeons.  Procedures Performed: 1. Ultrasound-guided transhepatic puncture of the gallbladder 2.  Placement of a 10-French  percutaneous cholecystostomy tube under fluoroscopic guidance  Interventional Radiologist:  Sterling Big, MD  Sedation: Moderate (conscious) sedation was used.  To mg Versed, 100 mcg Fentanyl were administered intravenously.  The patient's vital signs were monitored continuously by radiology nursing throughout the procedure.  Sedation Time: 15 minutes  Fluoroscopy time: 1.9 minutes  Contrast volume: 15 ml Omnipaque-300 injected into the biliary system  PROCEDURE/FINDINGS:   Informed consent was obtained from the patient following explanation of the procedure, risks, benefits and alternatives. The patient understands, agrees and consents for the procedure. All questions were addressed. A time out was performed.  Maximal barrier sterile technique utilized including caps, mask, sterile gowns, sterile gloves, large sterile drape, hand hygiene, and betadine skin prep.  The right upper quadrant was interrogated with ultrasound.  The gallbladder is distended and very thick walled.  An appropriate skin entry site to allow for a short transhepatic approach was selected and marked.  Local anesthesia was achieved by infiltration of 1% lidocaine.  Under direct sonographic guidance, the 21 gauge Accustick needle was advanced through approximately 2 cm of hepatic parenchyma and into the gallbladder lumen.  Needle position was confirmed by free return of purulent appearing bile.  The needle was exchanged over a 0.018 wire for the Accustick sheath.  An Amplatz wire was advanced through the Accustick sheath and the tract serially dilated to 10-French.  A 10 French Cook all-purpose drainage catheter was then advanced over the wire.  The locking loop was formed and approximately 60 ml of purulent, cloudy bile was successfully aspirated.  A sample was sent aside for culture. A gentle hand injection of contrast material confirms tube placement within the gallbladder lumen.  There is diffuse thickening and cobbling of the  gallbladder mucosa.  The drainage catheter was secured to the skin with a soft plastic bumper and 0-Prolene suture as well as an adhesive fixation device. The patient tolerated the procedure well, there was no immediate complication.  IMPRESSION:  1.  Technically successful ultrasound guided transhepatic percutaneous cholecystostomy tube placement. 2.  Approximately 60 ml of cloudy, purulent bile was successfully aspirated.  A sample will be sent for culture. 3.  The patient will be scheduled to return to interventional radiology in 4 - 6 weeks for routine cholecystostomy tube check and change. 4.  The long-term plan will be interval cholecystectomy at the time deemed appropriate by the consulting surgeons.  Signed,  Sterling Big, MD Vascular & Interventional Radiologist [facility]  Original Report Authenticated By: Malachy Moan, M.D.     Anti-infectives: Anti-infectives     Start     Dose/Rate Route Frequency Ordered Stop   09/14/12 0100   ciprofloxacin (CIPRO) IVPB 400 mg        400 mg 200 mL/hr over 60 Minutes Intravenous Every 12 hours 09/14/12 0033            Assessment/Plan: s/p perc cholecystostomy 11/30; check final cx's;monitor labs; cont drain flushes with sterile NS (once daily as OP); drain will need to remain in place at least 4-6 weeks to allow for tract  maturation before removing unless cholecystectomy done in interval; other plans as per CCS.  LOS: 2 days    Arihanna Estabrook,D [facility] 09/15/2012

## 2012-09-15 NOTE — Progress Notes (Signed)
Subjective: Had perc cholecystostomy tube placed. Tolerating clears. No n/v. +flatus. Feels better. Sore on Rt side  Objective: Vital signs in last 24 hours: Temp:  [97.3 F (36.3 C)-98.5 F (36.9 C)] 98.4 F (36.9 C) (12/01 0623) Pulse Rate:  [68-87] 74  (12/01 0623) Resp:  [15-25] 18  (12/01 0623) BP: (114-133)/(51-90) 114/63 mmHg (12/01 0623) SpO2:  [92 %-99 %] 94 % (12/01 0623) Last BM Date: 09/13/12  Intake/Output from previous day: 11/30 0701 - 12/01 0700 In: 2595 [P.O.:120; I.V.:2460] Out: 870 [Urine:625; Drains:245] Intake/Output this shift:    Alert, nad cta b/l Reg Soft, nd. Mild RUQ TTP. Drain -serosang  Lab Results:   Basename 09/15/12 0620 09/14/12 0644  WBC 7.5 9.8  HGB 13.0 13.1  HCT 39.1 39.8  PLT 245 238   BMET  Basename 09/15/12 0620 09/14/12 0644  NA 139 137  K 3.5 4.1  CL 103 99  CO2 28 31  GLUCOSE 124* 108*  BUN 7 12  CREATININE 1.02 1.19  CALCIUM 8.7 9.1   PT/INR  Basename 09/14/12 0644  LABPROT 13.9  INR 1.08   ABG No results found for this basename: PHART:2,PCO2:2,PO2:2,HCO3:2 in the last 72 hours  Studies/Results: Ct Abdomen Pelvis W Contrast  09/13/2012  *RADIOLOGY REPORT*  Clinical Data: Right-sided abdominal pain and palpable abnormality.  CT ABDOMEN AND PELVIS WITH CONTRAST  Technique:  Multidetector CT imaging of the abdomen and pelvis was performed following the standard protocol during bolus administration of intravenous contrast.  Contrast:  100 ml Omnipaque-300 and oral contrast  Comparison: None.  Findings: The gallbladder is distended and shows diffuse wall thickening, with mild pericholecystic soft tissue stranding.  This is consistent with acute cholecystitis.  Adjacent hyperemia is seen involving the hepatic parenchyma. There is no evidence of biliary ductal dilatation.  Several subtle hypervascular lesions each measuring approximately 1.5 cm are seen in both the right and left hepatic lobes.  These are nonspecific.   The pancreas, spleen, adrenal glands, and kidneys are unremarkable in appearance.  No evidence of hydronephrosis.  No evidence of lymphadenopathy.  A normal appendix is visualized.  Diverticulosis is seen involving the sigmoid colon, however there is no evidence of diverticulitis. No evidence of dilated bowel loops or hernia.  IMPRESSION:  1.  Findings consistent with acute cholecystitis. 2.  No evidence of biliary ductal dilatation or appendicitis. 3.   Diverticulosis.  No radiographic evidence of diverticulitis. 4.  Several small indeterminate hypervascular liver lesions. Non- emergent abdomen MRI without and with contrast is recommended for further characterization.   Original Report Authenticated By: Myles Rosenthal, M.D.    Ir Perc Cholecystostomy  09/14/2012  *RADIOLOGY REPORT*  IR PERCUTANEOUS CHOLECYSTOSTOMY  Date: 09/14/2012  Clinical History: 78 year old male with a history of acute cholecystitis.  His symptoms and an ongoing for a week and he is therefore a relatively high risk surgical candidate for bile duct injury.  Percutaneous cholecystostomy tube will be placed to facilitate drainage of the infected bile.  Once the acute inflammation has not decreased, the patient will undergo interval cholecystectomy at the discretion of the surgeons.  Procedures Performed: 1. Ultrasound-guided transhepatic puncture of the gallbladder 2.  Placement of a 10-French percutaneous cholecystostomy tube under fluoroscopic guidance  Interventional Radiologist:  Sterling Big, MD  Sedation: Moderate (conscious) sedation was used.  To mg Versed, 100 mcg Fentanyl were administered intravenously.  The patient's vital signs were monitored continuously by radiology nursing throughout the procedure.  Sedation Time: 15 minutes  Fluoroscopy time:  1.9 minutes  Contrast volume: 15 ml Omnipaque-300 injected into the biliary system  PROCEDURE/FINDINGS:   Informed consent was obtained from the patient following explanation of the  procedure, risks, benefits and alternatives. The patient understands, agrees and consents for the procedure. All questions were addressed. A time out was performed.  Maximal barrier sterile technique utilized including caps, mask, sterile gowns, sterile gloves, large sterile drape, hand hygiene, and betadine skin prep.  The right upper quadrant was interrogated with ultrasound.  The gallbladder is distended and very thick walled.  An appropriate skin entry site to allow for a short transhepatic approach was selected and marked.  Local anesthesia was achieved by infiltration of 1% lidocaine.  Under direct sonographic guidance, the 21 gauge Accustick needle was advanced through approximately 2 cm of hepatic parenchyma and into the gallbladder lumen.  Needle position was confirmed by free return of purulent appearing bile.  The needle was exchanged over a 0.018 wire for the Accustick sheath.  An Amplatz wire was advanced through the Accustick sheath and the tract serially dilated to 10-French.  A 10 French Cook all-purpose drainage catheter was then advanced over the wire.  The locking loop was formed and approximately 60 ml of purulent, cloudy bile was successfully aspirated.  A sample was sent aside for culture. A gentle hand injection of contrast material confirms tube placement within the gallbladder lumen.  There is diffuse thickening and cobbling of the gallbladder mucosa.  The drainage catheter was secured to the skin with a soft plastic bumper and 0-Prolene suture as well as an adhesive fixation device. The patient tolerated the procedure well, there was no immediate complication.  IMPRESSION:  1.  Technically successful ultrasound guided transhepatic percutaneous cholecystostomy tube placement. 2.  Approximately 60 ml of cloudy, purulent bile was successfully aspirated.  A sample will be sent for culture. 3.  The patient will be scheduled to return to interventional radiology in 4 - 6 weeks for routine  cholecystostomy tube check and change. 4.  The long-term plan will be interval cholecystectomy at the time deemed appropriate by the consulting surgeons.  Signed,  Sterling Big, MD Vascular & Interventional Radiologist [facility]  Original Report Authenticated By: Malachy Moan, M.D.     Anti-infectives: Anti-infectives     Start     Dose/Rate Route Frequency Ordered Stop   09/14/12 0100   ciprofloxacin (CIPRO) IVPB 400 mg        400 mg 200 mL/hr over 60 Minutes Intravenous Every 12 hours 09/14/12 0033            Assessment/Plan: Acute cholecystitis s/p cholecystostomy tube Cont IV abx today Adv diet Drain teaching in AM F/u cultures. OOB, ambulate Probable home Monday  Mary Sella. Andrey Campanile, MD, FACS General, Bariatric, & Minimally Invasive Surgery [facility], [place]    LOS: 2 days    Atilano Ina 09/15/2012

## 2012-09-16 LAB — CBC WITH DIFFERENTIAL/PLATELET
Eosinophils Absolute: 0.7 10*3/uL (ref 0.0–0.7)
Hemoglobin: 14.1 g/dL (ref 13.0–17.0)
Lymphocytes Relative: 23 % (ref 12–46)
Lymphs Abs: 1.9 10*3/uL (ref 0.7–4.0)
Monocytes Relative: 10 % (ref 3–12)
Neutro Abs: 4.8 10*3/uL (ref 1.7–7.7)
Neutrophils Relative %: 59 % (ref 43–77)
Platelets: 263 10*3/uL (ref 150–400)
RBC: 4.89 MIL/uL (ref 4.22–5.81)
WBC: 8.1 10*3/uL (ref 4.0–10.5)

## 2012-09-16 LAB — COMPREHENSIVE METABOLIC PANEL
AST: 14 U/L (ref 0–37)
BUN: 5 mg/dL — ABNORMAL LOW (ref 6–23)
CO2: 29 mEq/L (ref 19–32)
Calcium: 8.8 mg/dL (ref 8.4–10.5)
Chloride: 103 mEq/L (ref 96–112)
Creatinine, Ser: 1.18 mg/dL (ref 0.50–1.35)
GFR calc Af Amer: 67 mL/min — ABNORMAL LOW (ref >90)
GFR calc non Af Amer: 58 mL/min — ABNORMAL LOW (ref >90)
Glucose, Bld: 106 mg/dL — ABNORMAL HIGH (ref 70–99)
Total Bilirubin: 0.3 mg/dL (ref 0.3–1.2)

## 2012-09-16 MED ORDER — CIPROFLOXACIN HCL 500 MG PO TABS: 500.0000 mg | ORAL_TABLET | Freq: Two times a day (BID) | ORAL | Status: DC

## 2012-09-16 MED ORDER — HYDROCODONE-ACETAMINOPHEN 5-325 MG PO TABS: 1.0000 | ORAL_TABLET | ORAL | Status: DC | PRN

## 2012-09-16 NOTE — Progress Notes (Signed)
UR of chart updated.  Pt to d/c home today with [facility] to assist wife with drain flushes. They stated that [facility] had helped them in the past and wished to remain with them. Referral called to [facility]. Discussed supplies with wife and where to obtain. Assigned RN, Okey Regal, will educate wife on flushing before pt leaves today.  Address and phone in [facility] are correct. Face to face form completed.

## 2012-09-16 NOTE — Discharge Summary (Signed)
Patient ID: Carl Mcconnell MRN: [id number] DOB/AGE: March 25, 1933 78 y.o.  Admit date: 09/13/2012 Discharge date: 09/16/2012  Procedures: placement of percutaneous cholecystostomy drain  Consults: None  Reason for Admission: 1 week history of RUQ pain. Has history of intermittent RUQ pain in the Past. Severe now but been hurting for last 7 days, No nausea or vomiting. No fever or chills.  Admission Diagnoses:  Acute on chronic cholecystitis  COPD  Hx of CVA   Hospital Course: the patient was admitted and placed on IV Cipro.  Due to the length of symptoms, surgery was deferred to a percutaneous cholecystostomy tube placement due to increase risk of complications.  He underwent this placement.  He tolerated this well.  After this, his diet was advanced as tolerated.  His pain was well controlled and was not requiring any narcotic pain medicine at time of discharge.  Hid drain was serosang in nature at time of discharge.  He was converted to oral abx therapy.  He was stable for dc home on HD# 4.    PE: Abd: soft, Nt, ND, drain with serosang output.  No bile seen, ? Hydrops mixed with sanguinous output.  Discharge Diagnoses:  1. Acute cholecystitis, s/p perc drain 2. H/o CVA 3. hypothyroidism  Discharge Medications:   Medication List     As of 09/16/2012  9:11 AM    TAKE these medications         acetaminophen 325 MG tablet   Commonly known as: TYLENOL   Take 650 mg by mouth every 6 (six) hours as needed. Pain/fever      aspirin 81 MG tablet   Take 81 mg by mouth daily.      ciprofloxacin 500 MG tablet   Commonly known as: CIPRO   Take 1 tablet (500 mg total) by mouth 2 (two) times daily.      HYDROcodone-acetaminophen 5-325 MG per tablet   Commonly known as: NORCO/VICODIN   Take 1-2 tablets by mouth every 4 (four) hours as needed.      levothyroxine 150 MCG tablet   Commonly known as: SYNTHROID, LEVOTHROID   Take one by mouth daily      VITAMIN B 12 PO   Take 1 tablet  by mouth daily.        Discharge Instructions:     Follow-up Information    Follow up with CORNETT,THOMAS A., MD. Schedule an appointment as soon as possible for a visit in 1 month.   Contact information:   [address] [place] [address] [phone]          Signed: Letha Cape 09/16/2012, 9:11 AM

## 2012-09-16 NOTE — Discharge Summary (Signed)
Agree with above.   Follow up with Dr. Luisa Hart.

## 2012-09-17 LAB — CULTURE, ROUTINE-ABSCESS: Culture: NO GROWTH

## 2012-09-17 NOTE — ED Provider Notes (Signed)
Medical screening examination/treatment/procedure(s) were performed by non-physician practitioner and as supervising physician I was immediately available for consultation/collaboration.   Benny Lennert, MD 09/17/12 1245

## 2012-09-20 ENCOUNTER — Telehealth (HOSPITAL_COMMUNITY): Payer: Self-pay | Admitting: Surgery

## 2012-10-04 ENCOUNTER — Encounter (INDEPENDENT_AMBULATORY_CARE_PROVIDER_SITE_OTHER): Payer: Self-pay | Admitting: Surgery

## 2012-10-04 ENCOUNTER — Ambulatory Visit (INDEPENDENT_AMBULATORY_CARE_PROVIDER_SITE_OTHER): Payer: Medicare Other | Admitting: Surgery

## 2012-10-04 ENCOUNTER — Telehealth (INDEPENDENT_AMBULATORY_CARE_PROVIDER_SITE_OTHER): Payer: Self-pay | Admitting: General Surgery

## 2012-10-04 VITALS — BP 110/72 | HR 72 | Temp 98.3°F | Resp 18 | Ht 68.0 in | Wt 146.8 lb

## 2012-10-04 DIAGNOSIS — K819 Cholecystitis, unspecified: Secondary | ICD-10-CM

## 2012-10-04 HISTORY — DX: Cholecystitis, unspecified: K81.9

## 2012-10-04 NOTE — Patient Instructions (Signed)

## 2012-10-04 NOTE — Telephone Encounter (Signed)
Amy, nurse with [facility], called for orders to continue to see pt at home once a week to monitor his chole drain until surgery is done.

## 2012-10-04 NOTE — Progress Notes (Signed)
Patient ID: Carl Mcconnell, male   DOB: 03/03/1933, 78 y.o.   MRN: [id number]  No chief complaint on file.   HPI Rayford L Cerrato is a 78 y.o. male.  Patient returns in followup after cholecystostomy tube placement 3 weeks ago. He is doing very well with minimal complaints except one pain episode last week. The drain is functioning well. He is tolerating his diet with minimal abdominal pain. HPI  Past Medical History  Diagnosis Date  . Hypothyroidism   . Stroke   . Degenerative disc disease     LS  . Colon polyp   . Anemia   . ED (erectile dysfunction)   . COPD (chronic obstructive pulmonary disease)     Past Surgical History  Procedure Date  . Hernia repair 1998  . Eye surgery 2000    left eye  . Rectal abscess 10/2002    foreign body    Family History  Problem Relation Age of Onset  . Heart disease Mother     CAD  . Stroke Father   . Diabetes Sister   . Hypertension Sister   . Heart disease Brother     MI    Social History History  Substance Use Topics  . Smoking status: Current Every Day Smoker -- 1.0 packs/day for 64 years    Types: Cigarettes  . Smokeless tobacco: Not on file     Comment: tried chantix but cannot use it/not sure about OTC meds for smoking cessation   . Alcohol Use: No    Allergies  Allergen Reactions  . Varenicline Tartrate     REACTION: ---in HIGH dosage (patient can take lower dose) Caused eyes out of focus, h/a,nausea and achiness    Current Outpatient Prescriptions  Medication Sig Dispense Refill  . acetaminophen (TYLENOL) 325 MG tablet Take 650 mg by mouth every 6 (six) hours as needed. Pain/fever      . aspirin 81 MG tablet Take 81 mg by mouth daily.       . Cyanocobalamin (VITAMIN B 12 PO) Take 1 tablet by mouth daily.      . levothyroxine (SYNTHROID, LEVOTHROID) 150 MCG tablet Take one by mouth daily  90 tablet  2  . ciprofloxacin (CIPRO) 500 MG tablet Take 1 tablet (500 mg total) by mouth 2 (two) times daily.  10 tablet  0  .  HYDROcodone-acetaminophen (NORCO/VICODIN) 5-325 MG per tablet Take 1-2 tablets by mouth every 4 (four) hours as needed.  30 tablet  0    Review of Systems Review of Systems  Constitutional: Negative.   HENT: Negative.   Respiratory: Negative.   Cardiovascular: Negative.   Gastrointestinal: Negative.     Blood pressure 110/72, pulse 72, temperature 98.3 F (36.8 C), resp. rate 18, height 5' 8" (1.727 m), weight 146 lb 12.8 oz (66.588 kg).  Physical Exam Physical Exam  Constitutional: He is oriented to person, place, and time. He appears well-developed and well-nourished.  HENT:  Head: Normocephalic and atraumatic.  Eyes: EOM are normal. Pupils are equal, round, and reactive to light.  Neck: Normal range of motion. Neck supple.  Pulmonary/Chest: Effort normal and breath sounds normal.  Abdominal: Soft. Bowel sounds are normal. There is no tenderness.       Drain site clean  Musculoskeletal: Normal range of motion.  Neurological: He is alert and oriented to person, place, and time.  Skin: Skin is warm and dry.  Psychiatric: He has a normal mood and affect. His behavior is normal.   Judgment and thought content normal.      Assessment    Chronic cholecystitis with cholecystostomy tube improved Patient Active Problem List  Diagnosis  . HYPOTHYROIDISM  . UNSPECIFIED ANEMIA  . TOBACCO ABUSE  . C V A/STROKE  . COPD  . SEBACEOUS CYST  . DEGENERATIVE DISC DISEASE, LUMBAR SPINE  . ANEMIA, IRON DEFICIENCY  . CERUMEN IMPACTION, BILATERAL  . INGUINAL HERNIA  . ERECTILE DYSFUNCTION, ORGANIC  . BLOOD IN STOOL, OCCULT  . COLONIC POLYPS, ADENOMATOUS, HX OF  . Elevated blood pressure (not hypertension)  . Cholecystitis      Plan    Laparoscopic cholecystectomy cholangiogram. Discussed higher rate of conversion to open procedure given his circumstances. They like to proceed.The procedure has been discussed with the patient. Operative and non operative treatments have been  discussed. Risks of surgery include bleeding, infection,  Common bile duct injury,  Injury to the stomach,liver, colon,small intestine, abdominal wall,  Diaphragm,  Major blood vessels,  And the need for an open procedure.  Other risks include worsening of medical problems, death,  DVT and pulmonary embolism, and cardiovascular events.   Medical options have also been discussed. The patient has been informed of long term expectations of surgery and non surgical options,  The patient agrees to proceed.         Taelynn Mcelhannon A. 10/04/2012, 11:50 AM    

## 2012-10-11 NOTE — Telephone Encounter (Signed)
Per verbal orders by Marcelino Duster stating that it is ok to continue to monitor patient once a week until surgery.Thornton Papas nurse is aware.

## 2012-10-15 ENCOUNTER — Encounter (HOSPITAL_COMMUNITY): Payer: Self-pay | Admitting: Pharmacy Technician

## 2012-10-23 ENCOUNTER — Encounter (HOSPITAL_COMMUNITY): Payer: Self-pay

## 2012-10-23 ENCOUNTER — Encounter (HOSPITAL_COMMUNITY)
Admission: RE | Admit: 2012-10-23 | Discharge: 2012-10-23 | Disposition: A | Discharge status: Home / Self Care | Payer: Medicare Other | Source: Ambulatory Visit | Attending: Surgery | Admitting: Surgery

## 2012-10-23 ENCOUNTER — Telehealth (INDEPENDENT_AMBULATORY_CARE_PROVIDER_SITE_OTHER): Payer: Self-pay | Admitting: General Surgery

## 2012-10-23 HISTORY — DX: Shortness of breath: R06.02

## 2012-10-23 LAB — COMPREHENSIVE METABOLIC PANEL
ALT: 6 U/L (ref 0–53)
AST: 14 U/L (ref 0–37)
Calcium: 9.8 mg/dL (ref 8.4–10.5)
Sodium: 136 mEq/L (ref 135–145)
Total Protein: 8.1 g/dL (ref 6.0–8.3)

## 2012-10-23 LAB — SURGICAL PCR SCREEN: Staphylococcus aureus: NEGATIVE

## 2012-10-23 LAB — CBC
HCT: 43.6 % (ref 39.0–52.0)
Hemoglobin: 14.4 g/dL (ref 13.0–17.0)
MCH: 28 pg (ref 26.0–34.0)
MCHC: 33 g/dL (ref 30.0–36.0)
RBC: 5.15 MIL/uL (ref 4.22–5.81)

## 2012-10-23 NOTE — Pre-Procedure Instructions (Signed)
[address]  10/23/2012   Your procedure is scheduled on:   Tuesday   January 14,2014  Report to [facility] at 920 AM.  Call this number if you have problems the morning of surgery: [phone]   Remember:   Do not eat food OR DRINK:After Midnight.   Take these medicines the morning of surgery with A SIP OF WATER: SYNTHROID  (stop aspirin, coumadin, plavix, effient, herbal medicines)   Do not wear jewelry, make-up or nail polish.  Do not wear lotions, powders, or perfumes. You may wear deodorant.  Do not shave 48 hours prior to surgery. Men may shave face and neck.  Do not bring valuables to the hospital.  Contacts, dentures or bridgework may not be worn into surgery.  Leave suitcase in the car. After surgery it may be brought to your room.  For patients admitted to the hospital, checkout time is 11:00 AM the day of discharge.   Patients discharged the day of surgery will not be allowed to drive home.  Name and phone number of your driver:  Special Instructions: Shower using CHG 2 nights before surgery and the night before surgery.  If you shower the day of surgery use CHG.  Use special wash - you have one bottle of CHG for all showers.  You should use approximately 1/3 of the bottle for each shower.   Please read over the following fact sheets that you were given: Pain Booklet, Coughing and Deep Breathing, MRSA Information and Surgical Site Infection Prevention

## 2012-10-23 NOTE — Progress Notes (Signed)
PATIENT STATED THAT HE WAS HAVING A LOT OF PAIN WHEN BULB GETS FULL AND WONDERED WHY HE HAD A LUMPY VEIN WHERE IV HAD BEEN IN November.  IV SITE IS NONTENDER AND NOT HARD JUST FEW LUMPS FELT. SITE NOT RED OR SWOLLEN.  PATIENT WAS INSTRUCTED TO CALL DR CORNETT .

## 2012-10-23 NOTE — Progress Notes (Signed)
I CALLED BOTH EKG AND 2 LINK. EKG SHOWING FROM 1054 IN EPIC IS NOT MR SHARPES.  EKG DONE AT 1002 IS CORRECT. THEY ARE TRYING TO RESOLVE ISSUE.  TWO EKGS WERE DONE UNDER MR. SHARPES NAME.

## 2012-10-23 NOTE — Telephone Encounter (Signed)
Pt's friend called to report he is experiencing brief periods of chills and pain, that resolves with pain medicine.  It occurs 2-3 times a week.  There is no fever at any time.  He has just gone for his pre-op labs and they are WNL.  Reassured her and she will monitor his progress, calling back for fever of 101 or greater, or other changes.

## 2012-10-28 MED ORDER — DEXTROSE 5 % IV SOLN
3.0000 g | INTRAVENOUS | Status: AC
  Administered 2012-10-29: 3 g via INTRAVENOUS
  Filled 2012-10-28: qty 3000

## 2012-10-29 ENCOUNTER — Encounter (HOSPITAL_COMMUNITY): Payer: Self-pay | Admitting: Vascular Surgery

## 2012-10-29 ENCOUNTER — Ambulatory Visit (HOSPITAL_COMMUNITY): Payer: Medicare Other | Admitting: Anesthesiology

## 2012-10-29 ENCOUNTER — Encounter (HOSPITAL_COMMUNITY)
Admission: RE | Disposition: A | Discharge status: Home / Self Care | Payer: Self-pay | Source: Ambulatory Visit | Attending: Surgery

## 2012-10-29 ENCOUNTER — Encounter (HOSPITAL_COMMUNITY): Payer: Self-pay | Admitting: Anesthesiology

## 2012-10-29 ENCOUNTER — Encounter (HOSPITAL_COMMUNITY): Payer: Self-pay | Admitting: Surgery

## 2012-10-29 ENCOUNTER — Ambulatory Visit (HOSPITAL_COMMUNITY): Payer: Medicare Other

## 2012-10-29 ENCOUNTER — Ambulatory Visit (HOSPITAL_COMMUNITY)
Admission: RE | Admit: 2012-10-29 | Discharge: 2012-10-29 | Disposition: A | Discharge status: Home / Self Care | Payer: Medicare Other | Source: Ambulatory Visit | Attending: Surgery | Admitting: Surgery

## 2012-10-29 DIAGNOSIS — N529 Male erectile dysfunction, unspecified: Secondary | ICD-10-CM | POA: Insufficient documentation

## 2012-10-29 DIAGNOSIS — Z860100 Personal history of colon polyps, unspecified: Secondary | ICD-10-CM | POA: Insufficient documentation

## 2012-10-29 DIAGNOSIS — Z7982 Long term (current) use of aspirin: Secondary | ICD-10-CM | POA: Insufficient documentation

## 2012-10-29 DIAGNOSIS — M51379 Other intervertebral disc degeneration, lumbosacral region without mention of lumbar back pain or lower extremity pain: Secondary | ICD-10-CM | POA: Insufficient documentation

## 2012-10-29 DIAGNOSIS — K8010 Calculus of gallbladder with chronic cholecystitis without obstruction: Secondary | ICD-10-CM

## 2012-10-29 DIAGNOSIS — J4489 Other specified chronic obstructive pulmonary disease: Secondary | ICD-10-CM | POA: Insufficient documentation

## 2012-10-29 DIAGNOSIS — F17200 Nicotine dependence, unspecified, uncomplicated: Secondary | ICD-10-CM | POA: Insufficient documentation

## 2012-10-29 DIAGNOSIS — E039 Hypothyroidism, unspecified: Secondary | ICD-10-CM | POA: Insufficient documentation

## 2012-10-29 DIAGNOSIS — J449 Chronic obstructive pulmonary disease, unspecified: Secondary | ICD-10-CM | POA: Insufficient documentation

## 2012-10-29 DIAGNOSIS — Z823 Family history of stroke: Secondary | ICD-10-CM | POA: Insufficient documentation

## 2012-10-29 DIAGNOSIS — Z8249 Family history of ischemic heart disease and other diseases of the circulatory system: Secondary | ICD-10-CM | POA: Insufficient documentation

## 2012-10-29 DIAGNOSIS — Z833 Family history of diabetes mellitus: Secondary | ICD-10-CM | POA: Insufficient documentation

## 2012-10-29 DIAGNOSIS — Z8673 Personal history of transient ischemic attack (TIA), and cerebral infarction without residual deficits: Secondary | ICD-10-CM | POA: Insufficient documentation

## 2012-10-29 DIAGNOSIS — M5137 Other intervertebral disc degeneration, lumbosacral region: Secondary | ICD-10-CM | POA: Insufficient documentation

## 2012-10-29 HISTORY — PX: CHOLECYSTECTOMY: SHX55

## 2012-10-29 SURGERY — LAPAROSCOPIC CHOLECYSTECTOMY WITH INTRAOPERATIVE CHOLANGIOGRAM
Anesthesia: General | Site: Abdomen | Wound class: Clean Contaminated

## 2012-10-29 MED ORDER — OXYCODONE HCL 5 MG PO TABS
ORAL_TABLET | ORAL | Status: AC
  Filled 2012-10-29: qty 1

## 2012-10-29 MED ORDER — KETOROLAC TROMETHAMINE 15 MG/ML IJ SOLN
15.0000 mg | Freq: Four times a day (QID) | INTRAMUSCULAR | Status: DC
  Administered 2012-10-29: 15 mg via INTRAVENOUS

## 2012-10-29 MED ORDER — ACETAMINOPHEN 325 MG PO TABS: 650.0000 mg | ORAL_TABLET | ORAL | Status: DC | PRN

## 2012-10-29 MED ORDER — ROCURONIUM BROMIDE 100 MG/10ML IV SOLN
INTRAVENOUS | Status: DC | PRN
  Administered 2012-10-29: 40 mg via INTRAVENOUS

## 2012-10-29 MED ORDER — GLYCOPYRROLATE 0.2 MG/ML IJ SOLN
INTRAMUSCULAR | Status: DC | PRN
  Administered 2012-10-29: 0.6 mg via INTRAVENOUS

## 2012-10-29 MED ORDER — SODIUM CHLORIDE 0.9 % IR SOLN
Status: DC | PRN
  Administered 2012-10-29: 1000 mL

## 2012-10-29 MED ORDER — KETOROLAC TROMETHAMINE 30 MG/ML IJ SOLN
INTRAMUSCULAR | Status: AC
  Filled 2012-10-29: qty 1

## 2012-10-29 MED ORDER — MORPHINE SULFATE 2 MG/ML IJ SOLN: 2.0000 mg | INTRAMUSCULAR | Status: DC | PRN

## 2012-10-29 MED ORDER — HYDROMORPHONE HCL PF 1 MG/ML IJ SOLN: 0.2500 mg | INTRAMUSCULAR | Status: DC | PRN

## 2012-10-29 MED ORDER — SODIUM CHLORIDE 0.9 % IV SOLN: 250.0000 mL | INTRAVENOUS | Status: DC | PRN

## 2012-10-29 MED ORDER — ACETAMINOPHEN 650 MG RE SUPP: 650.0000 mg | RECTAL | Status: DC | PRN

## 2012-10-29 MED ORDER — LIDOCAINE HCL (CARDIAC) 20 MG/ML IV SOLN
INTRAVENOUS | Status: DC | PRN
  Administered 2012-10-29: 100 mg via INTRAVENOUS

## 2012-10-29 MED ORDER — BUPIVACAINE-EPINEPHRINE PF 0.25-1:200000 % IJ SOLN
INTRAMUSCULAR | Status: AC
  Filled 2012-10-29: qty 30

## 2012-10-29 MED ORDER — EPHEDRINE SULFATE 50 MG/ML IJ SOLN
INTRAMUSCULAR | Status: DC | PRN
  Administered 2012-10-29: 20 mg via INTRAVENOUS

## 2012-10-29 MED ORDER — OXYCODONE-ACETAMINOPHEN 5-325 MG PO TABS: 2.0000 | ORAL_TABLET | ORAL | Status: DC | PRN

## 2012-10-29 MED ORDER — OXYCODONE HCL 5 MG PO TABS
5.0000 mg | ORAL_TABLET | ORAL | Status: DC | PRN
  Administered 2012-10-29: 5 mg via ORAL

## 2012-10-29 MED ORDER — PHENYLEPHRINE HCL 10 MG/ML IJ SOLN
INTRAMUSCULAR | Status: DC | PRN
  Administered 2012-10-29: 160 ug via INTRAVENOUS
  Administered 2012-10-29 (×4): 40 ug via INTRAVENOUS
  Administered 2012-10-29: 80 ug via INTRAVENOUS

## 2012-10-29 MED ORDER — CHLORHEXIDINE GLUCONATE 4 % EX LIQD
1.0000 | Freq: Once | CUTANEOUS | Status: DC
Start: 2012-10-29 — End: 2012-10-29

## 2012-10-29 MED ORDER — SODIUM CHLORIDE 0.9 % IJ SOLN: 3.0000 mL | INTRAMUSCULAR | Status: DC | PRN

## 2012-10-29 MED ORDER — ONDANSETRON HCL 4 MG/2ML IJ SOLN: 4.0000 mg | Freq: Four times a day (QID) | INTRAMUSCULAR | Status: DC | PRN

## 2012-10-29 MED ORDER — LACTATED RINGERS IV SOLN
INTRAVENOUS | Status: DC
  Administered 2012-10-29: 11:00:00 via INTRAVENOUS

## 2012-10-29 MED ORDER — PROPOFOL 10 MG/ML IV BOLUS
INTRAVENOUS | Status: DC | PRN
  Administered 2012-10-29: 40 mg via INTRAVENOUS
  Administered 2012-10-29: 160 mg via INTRAVENOUS

## 2012-10-29 MED ORDER — ONDANSETRON HCL 4 MG/2ML IJ SOLN
INTRAMUSCULAR | Status: DC | PRN
  Administered 2012-10-29: 4 mg via INTRAVENOUS

## 2012-10-29 MED ORDER — NEOSTIGMINE METHYLSULFATE 1 MG/ML IJ SOLN
INTRAMUSCULAR | Status: DC | PRN
  Administered 2012-10-29: 4 mg via INTRAVENOUS

## 2012-10-29 MED ORDER — SODIUM CHLORIDE 0.9 % IJ SOLN: 3.0000 mL | Freq: Two times a day (BID) | INTRAMUSCULAR | Status: DC

## 2012-10-29 MED ORDER — ARTIFICIAL TEARS OP OINT
TOPICAL_OINTMENT | OPHTHALMIC | Status: DC | PRN
  Administered 2012-10-29: 1 via OPHTHALMIC

## 2012-10-29 MED ORDER — BUPIVACAINE-EPINEPHRINE 0.25% -1:200000 IJ SOLN
INTRAMUSCULAR | Status: DC | PRN
  Administered 2012-10-29: 14 mL

## 2012-10-29 MED ORDER — FENTANYL CITRATE 0.05 MG/ML IJ SOLN
INTRAMUSCULAR | Status: DC | PRN
  Administered 2012-10-29 (×3): 50 ug via INTRAVENOUS

## 2012-10-29 MED ORDER — SODIUM CHLORIDE 0.9 % IV SOLN
INTRAVENOUS | Status: DC | PRN
  Administered 2012-10-29: 14:00:00

## 2012-10-29 MED ORDER — LACTATED RINGERS IV SOLN
INTRAVENOUS | Status: DC | PRN
  Administered 2012-10-29: 12:00:00 via INTRAVENOUS

## 2012-10-29 SURGICAL SUPPLY — 42 items
ADH SKN CLS APL DERMABOND .7 (GAUZE/BANDAGES/DRESSINGS) ×1
APPLIER CLIP ROT 10 11.4 M/L (STAPLE) ×2
APR CLP MED LRG 11.4X10 (STAPLE) ×1
BAG SPEC RTRVL LRG 6X4 10 (ENDOMECHANICALS) ×1
BLADE SURG ROTATE 9660 (MISCELLANEOUS) IMPLANT
CANISTER SUCTION 2500CC (MISCELLANEOUS) ×2 IMPLANT
CHLORAPREP W/TINT 26ML (MISCELLANEOUS) ×2 IMPLANT
CLIP APPLIE ROT 10 11.4 M/L (STAPLE) ×1 IMPLANT
CLOTH BEACON ORANGE TIMEOUT ST (SAFETY) ×2 IMPLANT
COVER MAYO STAND STRL (DRAPES) ×2 IMPLANT
COVER SURGICAL LIGHT HANDLE (MISCELLANEOUS) ×2 IMPLANT
DECANTER SPIKE VIAL GLASS SM (MISCELLANEOUS) ×4 IMPLANT
DERMABOND ADVANCED (GAUZE/BANDAGES/DRESSINGS) ×1
DERMABOND ADVANCED .7 DNX12 (GAUZE/BANDAGES/DRESSINGS) ×1 IMPLANT
DRAPE C-ARM 42X72 X-RAY (DRAPES) ×2 IMPLANT
DRAPE UTILITY 15X26 W/TAPE STR (DRAPE) ×4 IMPLANT
DRAPE WARM FLUID 44X44 (DRAPE) ×2 IMPLANT
ELECT REM PT RETURN 9FT ADLT (ELECTROSURGICAL) ×2
ELECTRODE REM PT RTRN 9FT ADLT (ELECTROSURGICAL) ×1 IMPLANT
GLOVE BIO SURGEON STRL SZ8 (GLOVE) ×2 IMPLANT
GLOVE BIOGEL PI IND STRL 7.5 (GLOVE) IMPLANT
GLOVE BIOGEL PI IND STRL 8 (GLOVE) ×1 IMPLANT
GLOVE BIOGEL PI INDICATOR 7.5 (GLOVE) ×1
GLOVE BIOGEL PI INDICATOR 8 (GLOVE) ×1
GOWN STRL NON-REIN LRG LVL3 (GOWN DISPOSABLE) ×8 IMPLANT
KIT BASIN OR (CUSTOM PROCEDURE TRAY) ×2 IMPLANT
KIT ROOM TURNOVER OR (KITS) ×2 IMPLANT
NS IRRIG 1000ML POUR BTL (IV SOLUTION) ×4 IMPLANT
PAD ARMBOARD 7.5X6 YLW CONV (MISCELLANEOUS) ×2 IMPLANT
POUCH SPECIMEN RETRIEVAL 10MM (ENDOMECHANICALS) ×2 IMPLANT
SCISSORS LAP 5X35 DISP (ENDOMECHANICALS) IMPLANT
SET CHOLANGIOGRAPH 5 50 .035 (SET/KITS/TRAYS/PACK) ×2 IMPLANT
SET IRRIG TUBING LAPAROSCOPIC (IRRIGATION / IRRIGATOR) ×2 IMPLANT
SLEEVE ENDOPATH XCEL 5M (ENDOMECHANICALS) ×2 IMPLANT
SPECIMEN JAR SMALL (MISCELLANEOUS) ×2 IMPLANT
SUT MNCRL AB 4-0 PS2 18 (SUTURE) ×2 IMPLANT
TOWEL OR 17X24 6PK STRL BLUE (TOWEL DISPOSABLE) ×2 IMPLANT
TOWEL OR 17X26 10 PK STRL BLUE (TOWEL DISPOSABLE) ×2 IMPLANT
TRAY LAPAROSCOPIC (CUSTOM PROCEDURE TRAY) ×2 IMPLANT
TROCAR XCEL BLUNT TIP 100MML (ENDOMECHANICALS) ×2 IMPLANT
TROCAR XCEL NON-BLD 11X100MML (ENDOMECHANICALS) ×2 IMPLANT
TROCAR XCEL NON-BLD 5MMX100MML (ENDOMECHANICALS) ×2 IMPLANT

## 2012-10-29 NOTE — H&P (View-Only) (Signed)
Patient ID: Carl Mcconnell, male   DOB: February 06, 1933, 79 y.o.   MRN: [id number]  No chief complaint on file.   HPI Carl Mcconnell is a 79 y.o. male.  Patient returns in followup after cholecystostomy tube placement 3 weeks ago. He is doing very well with minimal complaints except one pain episode last week. The drain is functioning well. He is tolerating his diet with minimal abdominal pain. HPI  Past Medical History  Diagnosis Date  . Hypothyroidism   . Stroke   . Degenerative disc disease     LS  . Colon polyp   . Anemia   . ED (erectile dysfunction)   . COPD (chronic obstructive pulmonary disease)     Past Surgical History  Procedure Date  . Hernia repair 1998  . Eye surgery 2000    left eye  . Rectal abscess 10/2002    foreign body    Family History  Problem Relation Age of Onset  . Heart disease Mother     CAD  . Stroke Father   . Diabetes Sister   . Hypertension Sister   . Heart disease Brother     MI    Social History History  Substance Use Topics  . Smoking status: Current Every Day Smoker -- 1.0 packs/day for 64 years    Types: Cigarettes  . Smokeless tobacco: Not on file     Comment: tried chantix but cannot use it/not sure about OTC meds for smoking cessation   . Alcohol Use: No    Allergies  Allergen Reactions  . Varenicline Tartrate     REACTION: ---in HIGH dosage (patient can take lower dose) Caused eyes out of focus, h/a,nausea and achiness    Current Outpatient Prescriptions  Medication Sig Dispense Refill  . acetaminophen (TYLENOL) 325 MG tablet Take 650 mg by mouth every 6 (six) hours as needed. Pain/fever      . aspirin 81 MG tablet Take 81 mg by mouth daily.       . Cyanocobalamin (VITAMIN B 12 PO) Take 1 tablet by mouth daily.      Marland Kitchen levothyroxine (SYNTHROID, LEVOTHROID) 150 MCG tablet Take one by mouth daily  90 tablet  2  . ciprofloxacin (CIPRO) 500 MG tablet Take 1 tablet (500 mg total) by mouth 2 (two) times daily.  10 tablet  0  .  HYDROcodone-acetaminophen (NORCO/VICODIN) 5-325 MG per tablet Take 1-2 tablets by mouth every 4 (four) hours as needed.  30 tablet  0    Review of Systems Review of Systems  Constitutional: Negative.   HENT: Negative.   Respiratory: Negative.   Cardiovascular: Negative.   Gastrointestinal: Negative.     Blood pressure 110/72, pulse 72, temperature 98.3 F (36.8 C), resp. rate 18, height 5\' 8"  (1.727 m), weight 146 lb 12.8 oz (66.588 kg).  Physical Exam Physical Exam  Constitutional: He is oriented to person, place, and time. He appears well-developed and well-nourished.  HENT:  Head: Normocephalic and atraumatic.  Eyes: EOM are normal. Pupils are equal, round, and reactive to light.  Neck: Normal range of motion. Neck supple.  Pulmonary/Chest: Effort normal and breath sounds normal.  Abdominal: Soft. Bowel sounds are normal. There is no tenderness.       Drain site clean  Musculoskeletal: Normal range of motion.  Neurological: He is alert and oriented to person, place, and time.  Skin: Skin is warm and dry.  Psychiatric: He has a normal mood and affect. His behavior is normal.  Judgment and thought content normal.      Assessment    Chronic cholecystitis with cholecystostomy tube improved Patient Active Problem List  Diagnosis  . HYPOTHYROIDISM  . UNSPECIFIED ANEMIA  . TOBACCO ABUSE  . C V A/STROKE  . COPD  . SEBACEOUS CYST  . DEGENERATIVE DISC DISEASE, LUMBAR SPINE  . ANEMIA, IRON DEFICIENCY  . CERUMEN IMPACTION, BILATERAL  . INGUINAL HERNIA  . ERECTILE DYSFUNCTION, ORGANIC  . BLOOD IN STOOL, OCCULT  . COLONIC POLYPS, ADENOMATOUS, HX OF  . Elevated blood pressure (not hypertension)  . Cholecystitis      Plan    Laparoscopic cholecystectomy cholangiogram. Discussed higher rate of conversion to open procedure given his circumstances. They like to proceed.The procedure has been discussed with the patient. Operative and non operative treatments have been  discussed. Risks of surgery include bleeding, infection,  Common bile duct injury,  Injury to the stomach,liver, colon,small intestine, abdominal wall,  Diaphragm,  Major blood vessels,  And the need for an open procedure.  Other risks include worsening of medical problems, death,  DVT and pulmonary embolism, and cardiovascular events.   Medical options have also been discussed. The patient has been informed of long term expectations of surgery and non surgical options,  The patient agrees to proceed.         Ranetta Armacost A. 10/04/2012, 11:50 AM

## 2012-10-29 NOTE — Anesthesia Postprocedure Evaluation (Signed)
  Anesthesia Post-op Note  Patient: Carl Mcconnell  Procedure(s) Performed: Procedure(s) (LRB) with comments: LAPAROSCOPIC CHOLECYSTECTOMY WITH INTRAOPERATIVE CHOLANGIOGRAM (N/A) - laparoscopic cholecystectomy with intraoperative cholangiogram  Patient Location: PACU  Anesthesia Type:General  Level of Consciousness: awake  Airway and Oxygen Therapy: Patient Spontanous Breathing  Post-op Pain: mild  Post-op Assessment: Post-op Vital signs reviewed  Post-op Vital Signs: Reviewed  Complications: No apparent anesthesia complications

## 2012-10-29 NOTE — Interval H&P Note (Signed)
History and Physical Interval Note:  10/29/2012 11:57 AM  Carl Mcconnell  has presented today for surgery, with the diagnosis of cholecystitis  The various methods of treatment have been discussed with the patient and family. After consideration of risks, benefits and other options for treatment, the patient has consented to  Procedure(s) (LRB) with comments: LAPAROSCOPIC CHOLECYSTECTOMY WITH INTRAOPERATIVE CHOLANGIOGRAM (N/A) - laparoscopic cholecystectomy with intraoperative cholangiogram as a surgical intervention .  The patient's history has been reviewed, patient examined, no change in status, stable for surgery.  I have reviewed the patient's chart and labs.  Questions were answered to the patient's satisfaction.     [name] A.

## 2012-10-29 NOTE — Preoperative (Signed)
Beta Blockers   Reason not to administer Beta Blockers:Not Applicable 

## 2012-10-29 NOTE — Op Note (Signed)
Laparoscopic Cholecystectomy with IOC Procedure Note  Indications: This patient presents with symptomatic gallbladder disease and will undergo laparoscopic cholecystectomy.The procedure has been discussed with the patient. Operative and non operative treatments have been discussed. Risks of surgery include bleeding, infection,  Common bile duct injury,  Injury to the stomach,liver, colon,small intestine, abdominal wall,  Diaphragm,  Major blood vessels,  And the need for an open procedure.  Other risks include worsening of medical problems, death,  DVT and pulmonary embolism, and cardiovascular events.   Medical options have also been discussed. The patient has been informed of long term expectations of surgery and non surgical options,  The patient agrees to proceed.    Pre-operative Diagnosis: Chronic cholecystitis  Post-operative Diagnosis: Chronic cholecystitis  Surgeon: Cordarious Zeek A.   Assistants: OR  Anesthesia: General endotracheal anesthesia and Local anesthesia 0.25.% bupivacaine, with epinephrine  ASA Class: 3  Procedure Details  The patient was seen again in the Holding Room. The risks, benefits, complications, treatment options, and expected outcomes were discussed with the patient. The possibilities of reaction to medication, pulmonary aspiration, perforation of viscus, bleeding, recurrent infection, finding a normal gallbladder, the need for additional procedures, failure to diagnose a condition, the possible need to convert to an open procedure, and creating a complication requiring transfusion or operation were discussed with the patient. The patient and/or family concurred with the proposed plan, giving informed consent. The site of surgery properly noted/marked. The patient was taken to Operating Room, identified as Carl Mcconnell and the procedure verified as Laparoscopic Cholecystectomy with Intraoperative Cholangiograms. A Time Out was held and the above information  confirmed.  Prior to the induction of general anesthesia, antibiotic prophylaxis was administered. Percutaneous cholecystostomy tube was removed.  General endotracheal anesthesia was then administered and tolerated well. After the induction, the abdomen was prepped in the usual sterile fashion. The patient was positioned in the supine position with the left arm comfortably tucked, along with some reverse Trendelenburg.  Local anesthetic agent was injected into the skin near the umbilicus and an incision made. The midline fascia was incised and the Hasson technique was used to introduce a 12 mm port under direct vision. It was secured with a figure of eight Vicryl suture placed in the usual fashion. Pneumoperitoneum was then created with CO2 and tolerated well without any adverse changes in the patient's vital signs. Additional trocars were introduced under direct vision with an 11 mm trocar in the epigastrium and two 5 mm trocars in the right upper quadrant. All skin incisions were infiltrated with a local anesthetic agent before making the incision and placing the trocars.   The gallbladder was identified, the fundus grasped and retracted cephalad. Adhesions were lysed bluntly and with the electrocautery where indicated, taking care not to injure any adjacent organs or viscus. The infundibulum was grasped and retracted laterally, exposing the peritoneum overlying the triangle of Calot. This was then divided and exposed in a blunt fashion. The cystic duct was clearly identified and bluntly dissected circumferentially. The junctions of the gallbladder, cystic duct and common bile duct were clearly identified prior to the division of any linear structure.   An incision was made in the cystic duct and the cholangiogram catheter introduced. The catheter was secured using an endoclip. The study showed no stones and good visualization of the distal and proximal biliary tree. The catheter was then removed.   The  cystic duct was then  ligated with surgical clips  on the patient side and  clipped on the gallbladder side and divided. The cystic artery was identified, dissected free, ligated with clips and divided as well. Posterior cystic artery clipped and divided.  The gallbladder was dissected from the liver bed in retrograde fashion with the electrocautery. The gallbladder was removed. The liver bed was irrigated and inspected. Hemostasis was achieved with the electrocautery. Copious irrigation was utilized and was repeatedly aspirated until clear all particulate matter. Hemostasis was achieved with no signs  Of bleeding or bile leakage.  Pneumoperitoneum was completely reduced after viewing removal of the trocars under direct vision. The wound was thoroughly irrigated and the fascia was then closed with a figure of eight suture; the skin was then closed with 4 O monocryl and a sterile dressing was applied of Dermabond.  Instrument, sponge, and needle counts were correct at closure and at the conclusion of the case.   Findings: Cholecystitis with Cholelithiasis  Estimated Blood Loss: Minimal         Drains: none         Total IV Fluids: 1500 mL         Specimens: Gallbladder           Complications: None; patient tolerated the procedure well.         Disposition: PACU - hemodynamically stable.         Condition: stable

## 2012-10-29 NOTE — Anesthesia Preprocedure Evaluation (Addendum)
Anesthesia Evaluation  Patient identified by MRN, date of birth, ID band Patient awake    Reviewed: Allergy & Precautions, H&P , NPO status , Patient's Chart, lab work & pertinent test results  Airway Mallampati: II      Dental   Pulmonary shortness of breath and with exertion, COPD breath sounds clear to auscultation        Cardiovascular Rhythm:Regular Rate:Normal     Neuro/Psych CVA    GI/Hepatic Neg liver ROS,   Endo/Other  Hypothyroidism   Renal/GU negative Renal ROS     Musculoskeletal   Abdominal   Peds  Hematology   Anesthesia Other Findings   Reproductive/Obstetrics                          Anesthesia Physical Anesthesia Plan  ASA: III  Anesthesia Plan: General   Post-op Pain Management:    Induction: Intravenous  Airway Management Planned: Oral ETT  Additional Equipment:   Intra-op Plan:   Post-operative Plan: Extubation in OR  Informed Consent: I have reviewed the patients History and Physical, chart, labs and discussed the procedure including the risks, benefits and alternatives for the proposed anesthesia with the patient or authorized representative who has indicated his/her understanding and acceptance.   Dental advisory given  Plan Discussed with: CRNA and Anesthesiologist  Anesthesia Plan Comments:         Anesthesia Quick Evaluation

## 2012-10-29 NOTE — Transfer of Care (Signed)
Immediate Anesthesia Transfer of Care Note  Patient: Carl Mcconnell  Procedure(s) Performed: Procedure(s) (LRB) with comments: LAPAROSCOPIC CHOLECYSTECTOMY WITH INTRAOPERATIVE CHOLANGIOGRAM (N/A) - laparoscopic cholecystectomy with intraoperative cholangiogram  Patient Location: PACU  Anesthesia Type:General  Level of Consciousness: awake, alert , oriented and sedated  Airway & Oxygen Therapy: Patient Spontanous Breathing and Patient connected to nasal cannula oxygen   Post-op Assessment: Report given to PACU RN, Post -op Vital signs reviewed and stable and Patient moving all extremities  Post vital signs: Reviewed and stable  Complications: No apparent anesthesia complications

## 2012-10-31 ENCOUNTER — Encounter (HOSPITAL_COMMUNITY): Payer: Self-pay | Admitting: Surgery

## 2012-11-15 ENCOUNTER — Ambulatory Visit (INDEPENDENT_AMBULATORY_CARE_PROVIDER_SITE_OTHER): Payer: Medicare Other | Admitting: Surgery

## 2012-11-15 ENCOUNTER — Encounter (INDEPENDENT_AMBULATORY_CARE_PROVIDER_SITE_OTHER): Payer: Self-pay | Admitting: Surgery

## 2012-11-15 VITALS — BP 101/62 | HR 70 | Temp 97.6°F | Resp 12 | Ht 68.0 in | Wt 142.4 lb

## 2012-11-15 DIAGNOSIS — Z9889 Other specified postprocedural states: Secondary | ICD-10-CM

## 2012-11-15 NOTE — Patient Instructions (Signed)
Return as needed

## 2012-11-15 NOTE — Progress Notes (Signed)
He is here for a postop visit following laparoscopic cholecystectomy.  Diet is being tolerated, bowels are moving.  No problems with incisions.  PE:  ABD:  Soft, incisions clean/dry/intact and solid.  Assessment:  Doing well postop.  Plan:  Lowfat diet recommended.  Activities as tolerated.  Return visit prn. 

## 2013-04-02 ENCOUNTER — Other Ambulatory Visit: Payer: Self-pay | Admitting: Family Medicine

## 2013-04-02 NOTE — Telephone Encounter (Signed)
Electronic refill request, no recent/future appts., please advise  

## 2013-04-02 NOTE — Telephone Encounter (Signed)
Please schedule f/u appt and refill until then  

## 2013-04-08 NOTE — Telephone Encounter (Signed)
F/u appt scheduled for 04/28/13 at 8:45 am, and med refilled

## 2013-04-28 ENCOUNTER — Ambulatory Visit (INDEPENDENT_AMBULATORY_CARE_PROVIDER_SITE_OTHER)
Admission: RE | Admit: 2013-04-28 | Discharge: 2013-04-28 | Disposition: A | Discharge status: Home / Self Care | Payer: Medicare Other | Source: Ambulatory Visit | Attending: Family Medicine | Admitting: Family Medicine

## 2013-04-28 ENCOUNTER — Encounter: Payer: Self-pay | Admitting: Family Medicine

## 2013-04-28 ENCOUNTER — Ambulatory Visit (INDEPENDENT_AMBULATORY_CARE_PROVIDER_SITE_OTHER): Payer: Medicare Other | Admitting: Family Medicine

## 2013-04-28 VITALS — BP 124/86 | HR 81 | Temp 98.3°F | Ht 68.0 in | Wt 138.0 lb

## 2013-04-28 DIAGNOSIS — R0600 Dyspnea, unspecified: Secondary | ICD-10-CM | POA: Insufficient documentation

## 2013-04-28 DIAGNOSIS — R0989 Other specified symptoms and signs involving the circulatory and respiratory systems: Secondary | ICD-10-CM

## 2013-04-28 DIAGNOSIS — J449 Chronic obstructive pulmonary disease, unspecified: Secondary | ICD-10-CM

## 2013-04-28 DIAGNOSIS — H6120 Impacted cerumen, unspecified ear: Secondary | ICD-10-CM

## 2013-04-28 DIAGNOSIS — R0609 Other forms of dyspnea: Secondary | ICD-10-CM

## 2013-04-28 DIAGNOSIS — R5383 Other fatigue: Secondary | ICD-10-CM

## 2013-04-28 DIAGNOSIS — R630 Anorexia: Secondary | ICD-10-CM

## 2013-04-28 DIAGNOSIS — H6122 Impacted cerumen, left ear: Secondary | ICD-10-CM

## 2013-04-28 DIAGNOSIS — I6789 Other cerebrovascular disease: Secondary | ICD-10-CM

## 2013-04-28 DIAGNOSIS — R5381 Other malaise: Secondary | ICD-10-CM | POA: Insufficient documentation

## 2013-04-28 DIAGNOSIS — E039 Hypothyroidism, unspecified: Secondary | ICD-10-CM

## 2013-04-28 DIAGNOSIS — F17200 Nicotine dependence, unspecified, uncomplicated: Secondary | ICD-10-CM

## 2013-04-28 LAB — COMPREHENSIVE METABOLIC PANEL
ALT: 10 U/L (ref 0–53)
Albumin: 4 g/dL (ref 3.5–5.2)
CO2: 32 mEq/L (ref 19–32)
Chloride: 98 mEq/L (ref 96–112)
GFR: 62.24 mL/min (ref >60.00)
Glucose, Bld: 103 mg/dL — ABNORMAL HIGH (ref 70–99)
Potassium: 4.2 mEq/L (ref 3.5–5.1)
Sodium: 137 mEq/L (ref 135–145)
Total Bilirubin: 0.7 mg/dL (ref 0.3–1.2)
Total Protein: 8.1 g/dL (ref 6.0–8.3)

## 2013-04-28 LAB — CBC WITH DIFFERENTIAL/PLATELET
Basophils Absolute: 0 10*3/uL (ref 0.0–0.1)
Eosinophils Relative: 4.2 % (ref 0.0–5.0)
Lymphocytes Relative: 23.2 % (ref 12.0–46.0)
Monocytes Relative: 10.2 % (ref 3.0–12.0)
Neutrophils Relative %: 61.8 % (ref 43.0–77.0)
Platelets: 213 10*3/uL (ref 150.0–400.0)
RDW: 15.9 % — ABNORMAL HIGH (ref 11.5–14.6)
WBC: 7.7 10*3/uL (ref 4.5–10.5)

## 2013-04-28 LAB — LIPID PANEL
HDL: 47.5 mg/dL (ref >39.00)
Total CHOL/HDL Ratio: 3

## 2013-04-28 LAB — VITAMIN B12: Vitamin B-12: >1500 pg/mL — ABNORMAL HIGH (ref 211–911)

## 2013-04-28 MED ORDER — VARENICLINE TARTRATE 1 MG PO TABS: 1.0000 mg | ORAL_TABLET | Freq: Two times a day (BID) | ORAL | Status: DC

## 2013-04-28 NOTE — Assessment & Plan Note (Signed)
More fatigued and dec appetitie tsh today

## 2013-04-28 NOTE — Patient Instructions (Addendum)
Labs today  Chest xray today  Please try to quit smoking asap -use chantix 1 mg once daily  Follow up in 2-3 weeks  Get debrox solution and use in your left ear as directed twice weekly until you follow up

## 2013-04-28 NOTE — Assessment & Plan Note (Signed)
?   If copd related or rel to fatigue or poss mood disorder Disc need for balanced meals Lab today and f/u

## 2013-04-28 NOTE — Assessment & Plan Note (Signed)
Disc in detail risks of smoking and possible outcomes including copd, vascular/ heart disease, cancer , respiratory and sinus infections  Pt voices understanding Pt thinks he is ready to quit Wants to try chantix 1 mg once daily (instead of bid)-disc poss side eff in detail and he will update F/u planned

## 2013-04-28 NOTE — Assessment & Plan Note (Signed)
Suspect multifactorial with copd being the biggest cause Also disc poss mood disorder given his health frustrations of late Lab today and f/u planned to review Working on smoking cessation

## 2013-04-28 NOTE — Progress Notes (Signed)
Subjective:    Patient ID: Carl Mcconnell, male    DOB: Oct 24, 1932, 79 y.o.   MRN: [id number]  HPI Here for f/u - med refills  Appetite is down generally - and wt is down 4 lb (20)  Gets full fast and also not very hungry  Does not know why  Has been down due to his copd  He has a hard time getting his breath in the heat- so he cannot do as much - that is very frustrated  No loss of motivation Takes all his energy just to get up and move around   Still smokes - about a pack per day  Has tried to quit in the past - with chantix - and then thought it gave him GI distress- and it turns out it was his gallbladder  Had surgery and then some complication   Is willing to try chantix again  When he took one per day- it really helped his desire to smoke  He did not tolerate 2 a day well however - he thinks he wants to stick with once daily dosing and try it again  bp is very good today BP: 124/86 mmHg    Patient Active Problem List   Diagnosis Date Noted  . Other malaise and fatigue 04/28/2013  . Decreased appetite 04/28/2013  . Dyspnea 04/28/2013  . Cerumen impaction 04/28/2013  . Cholecystitis 10/04/2012  . ERECTILE DYSFUNCTION, ORGANIC 10/18/2010  . ANEMIA, IRON DEFICIENCY 09/27/2010  . BLOOD IN STOOL, OCCULT 09/27/2010  . CERUMEN IMPACTION, BILATERAL 09/26/2010  . INGUINAL HERNIA 09/26/2010  . UNSPECIFIED ANEMIA 09/22/2010  . TOBACCO ABUSE 09/14/2009  . COPD 07/22/2008  . SEBACEOUS CYST 07/22/2008  . COLONIC POLYPS, ADENOMATOUS, HX OF 07/22/2008  . HYPOTHYROIDISM 07/04/2007  . 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 DEGENERATIVE DISC DISEASE, LUMBAR SPINE 07/04/2007   Past Medical History  Diagnosis Date  . Hypothyroidism   . Colon polyp   . Anemia   . ED (erectile dysfunction)   . COPD (chronic obstructive pulmonary disease)   . Shortness of breath   . Stroke     vision trouble  . Degenerative disc disease     LS   Past Surgical History  Procedure Laterality Date  .  Hernia repair  1998  . Eye surgery  2000    left eye  . Rectal abscess  10/2002    foreign body  . No past surgeries       BILIARY TUBE PLACED RT SIDE  . Cholecystectomy  10/29/2012    Procedure: LAPAROSCOPIC CHOLECYSTECTOMY WITH INTRAOPERATIVE CHOLANGIOGRAM;  Surgeon: Clovis Pu. Cornett, MD;  Location: [facility] OR;  Service: General;  Laterality: N/A;  laparoscopic cholecystectomy with intraoperative cholangiogram   History  Substance Use Topics  . Smoking status: Current Every Day Smoker -- 1.00 packs/day for 64 years    Types: Cigarettes  . Smokeless tobacco: Not on file     Comment: tried chantix but cannot use it/not sure about OTC meds for smoking cessation   . Alcohol Use: No   Family History  Problem Relation Age of Onset  . Heart disease Mother     CAD  . Stroke Father   . Diabetes Sister   . Hypertension Sister   . Heart disease Brother     MI   Allergies  Allergen Reactions  . Chantix (Varenicline)     REACTION: ---in HIGH dosage (patient can take lower dose) Caused eyes out of focus, h/a,nausea and achiness  Current Outpatient Prescriptions on File Prior to Visit  Medication Sig Dispense Refill  . aspirin 81 MG tablet Take 81 mg by mouth daily.       . Cyanocobalamin (VITAMIN B 12 PO) Take 1 tablet by mouth daily.      Marland Kitchen SYNTHROID 150 MCG tablet TAKE 1 TABLET EVERY DAY  90 tablet  0   No current facility-administered medications on file prior to visit.    Review of Systems Review of Systems  Constitutional: Negative for fever, appetite change, and unexpected weight change.  Eyes: Negative for pain and visual disturbance.  Respiratory: pos for cough and sob on exertion as well as occ wheezing  Cardiovascular: Negative for cp or palpitations    Gastrointestinal: Negative for nausea, diarrhea and constipation.  Genitourinary: Negative for urgency and frequency.  Skin: Negative for pallor or rash   Neurological: Negative for weakness, light-headedness, numbness  and headaches.  Hematological: Negative for adenopathy. Does not bruise/bleed easily.  Psychiatric/Behavioral: Negative for dysphoric mood. The patient is not nervous/anxious.         Objective:   Physical Exam  Constitutional: He appears well-developed and well-nourished.  Seems generally fatigued   HENT:  Head: Normocephalic and atraumatic.  Right Ear: External ear normal.  Nose: Nose normal.  Mouth/Throat: Oropharynx is clear and moist. No oropharyngeal exudate.  Mild cerumen impaction in L ear with dec hearing   Eyes: Conjunctivae and EOM are normal. Pupils are equal, round, and reactive to light. Right eye exhibits no discharge. Left eye exhibits no discharge. No scleral icterus.  Neck: Normal range of motion. Neck supple. No JVD present. Carotid bruit is not present. No thyromegaly present.  Cardiovascular: Normal rate, regular rhythm and intact distal pulses.  Exam reveals no gallop.   Pulmonary/Chest: Effort normal. No respiratory distress. He has wheezes. He has no rales.  Diffusely distant bs Scant wheeze on forced exp   Abdominal: Soft. Bowel sounds are normal. He exhibits no distension, no abdominal bruit and no mass. There is no tenderness. There is no rebound and no guarding.  Musculoskeletal: He exhibits no edema and no tenderness.  Lymphadenopathy:    He has no cervical adenopathy.  Neurological: He is alert. He has normal reflexes. No cranial nerve deficit. He exhibits normal muscle tone. Coordination normal.  Skin: Skin is warm and dry. No rash noted. No erythema. No pallor.  Psychiatric: He has a normal mood and affect.          Assessment & Plan:

## 2013-04-28 NOTE — Assessment & Plan Note (Signed)
clinically worsening  cxr today  Plans to quit smoking with chantix  F/u planned - will likely need tx and perhaps pulmonary consult

## 2013-04-28 NOTE — Assessment & Plan Note (Signed)
bp in good control Lab today No clinical changes in function Can tolerate 81 mg asa daily  Working on smoking cessation

## 2013-04-28 NOTE — Assessment & Plan Note (Signed)
Suspect related to copd  Disc plan to quit smoking now  Lab and f/u  Will disc tx opt

## 2013-04-28 NOTE — Assessment & Plan Note (Signed)
inst to use debrox 2 times weekly and will eval for irrigation at f/u

## 2013-04-29 ENCOUNTER — Telehealth: Payer: Self-pay

## 2013-04-29 NOTE — Telephone Encounter (Signed)
[facility] faxed clarification of Chantix instructions or quantity. Answered form faxed back to [facility] and sent for scanning.

## 2013-05-16 ENCOUNTER — Ambulatory Visit (INDEPENDENT_AMBULATORY_CARE_PROVIDER_SITE_OTHER): Payer: Medicare Other | Admitting: Family Medicine

## 2013-05-16 ENCOUNTER — Encounter: Payer: Self-pay | Admitting: Family Medicine

## 2013-05-16 VITALS — BP 110/78 | HR 71 | Temp 97.5°F | Wt 140.2 lb

## 2013-05-16 DIAGNOSIS — H6120 Impacted cerumen, unspecified ear: Secondary | ICD-10-CM

## 2013-05-16 DIAGNOSIS — F17200 Nicotine dependence, unspecified, uncomplicated: Secondary | ICD-10-CM

## 2013-05-16 DIAGNOSIS — H6122 Impacted cerumen, left ear: Secondary | ICD-10-CM

## 2013-05-16 DIAGNOSIS — J449 Chronic obstructive pulmonary disease, unspecified: Secondary | ICD-10-CM

## 2013-05-16 MED ORDER — TIOTROPIUM BROMIDE MONOHYDRATE 18 MCG IN CAPS: 18.0000 ug | ORAL_CAPSULE | Freq: Every day | RESPIRATORY_TRACT | Status: DC

## 2013-05-16 NOTE — Patient Instructions (Addendum)
I sent spiriva inhaler px to your [facility] using it once daily  If any problems or side effects please let me know  Please try to totally quit smoking as soon as you can  Continue the chantix - I think this is helping  Follow up with me in about 2 months

## 2013-05-16 NOTE — Progress Notes (Signed)
Subjective:    Patient ID: Carl Mcconnell, male    DOB: [birth year], 79 y.o.   MRN: [id number]  HPI Here for f/u of fatigue/ dec appetite and copd and ear cerumen  Wt is up 2 lb with bmi of 21- eating more sandwiches  Wife is helping him with it   Labs were all wnl   Copd on cxr Given chantix to quit smoking -- has enabled him to cut down from 1 ppd to 1/2 ppd - happy about that  Also not finishing all of his cigarettes   He sleeps more during the day - and also sleeps well at night    His breathing may be a bit improved-in fact he is more active -and even did some yard work    Cerumen impaction noted   Patient Active Problem List   Diagnosis Date Noted  . Other malaise and fatigue 04/28/2013  . Decreased appetite 04/28/2013  . Dyspnea 04/28/2013  . Cerumen impaction 04/28/2013  . Cholecystitis 10/04/2012  . ERECTILE DYSFUNCTION, ORGANIC 10/18/2010  . ANEMIA, IRON DEFICIENCY 09/27/2010  . BLOOD IN STOOL, OCCULT 09/27/2010  . CERUMEN IMPACTION, BILATERAL 09/26/2010  . INGUINAL HERNIA 09/26/2010  . UNSPECIFIED ANEMIA 09/22/2010  . TOBACCO ABUSE 09/14/2009  . COPD 07/22/2008  . SEBACEOUS CYST 07/22/2008  . COLONIC POLYPS, ADENOMATOUS, HX OF 07/22/2008  . HYPOTHYROIDISM 07/04/2007  . 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 DEGENERATIVE DISC DISEASE, LUMBAR SPINE 07/04/2007   Past Medical History  Diagnosis Date  . Hypothyroidism   . Colon polyp   . Anemia   . ED (erectile dysfunction)   . COPD (chronic obstructive pulmonary disease)   . Shortness of breath   . Stroke     vision trouble  . Degenerative disc disease     LS   Past Surgical History  Procedure Laterality Date  . Hernia repair  1998  . Eye surgery  2000    left eye  . Rectal abscess  10/2002    foreign body  . No past surgeries       BILIARY TUBE PLACED RT SIDE  . Cholecystectomy  10/29/2012    Procedure: LAPAROSCOPIC CHOLECYSTECTOMY WITH INTRAOPERATIVE CHOLANGIOGRAM;  Surgeon: Clovis Pu. Cornett, MD;   Location: [facility] OR;  Service: General;  Laterality: N/A;  laparoscopic cholecystectomy with intraoperative cholangiogram   History  Substance Use Topics  . Smoking status: Current Every Day Smoker -- 1.00 packs/day for 64 years    Types: Cigarettes  . Smokeless tobacco: Not on file     Comment: tried chantix but cannot use it/not sure about OTC meds for smoking cessation   . Alcohol Use: No   Family History  Problem Relation Age of Onset  . Heart disease Mother     CAD  . Stroke Father   . Diabetes Sister   . Hypertension Sister   . Heart disease Brother     MI   Allergies  Allergen Reactions  . Chantix (Varenicline)     REACTION: ---in HIGH dosage (patient can take lower dose) Caused eyes out of focus, h/a,nausea and achiness    Current Outpatient Prescriptions on File Prior to Visit  Medication Sig Dispense Refill  . aspirin 81 MG tablet Take 81 mg by mouth daily.       . Cyanocobalamin (VITAMIN B 12 PO) Take 1 tablet by mouth daily.      Marland Kitchen SYNTHROID 150 MCG tablet TAKE 1 TABLET EVERY DAY  90 tablet  0  .  varenicline (CHANTIX) 1 MG tablet Take 1 tablet (1 mg total) by mouth 2 (two) times daily.  30 tablet  3   No current facility-administered medications on file prior to visit.    Review of Systems Review of Systems  Constitutional: Negative for fever, appetite change,  and unexpected weight change. pos for fatigue-it is improved  Eyes: Negative for pain and visual disturbance.  Respiratory: Negative for cough and pos for sob/ pos for mild wheezing in hot weather    Cardiovascular: Negative for cp or palpitations    Gastrointestinal: Negative for nausea, diarrhea and constipation.  Genitourinary: Negative for urgency and frequency.  Skin: Negative for pallor or rash   Neurological: Negative for weakness, light-headedness, numbness and headaches.  Hematological: Negative for adenopathy. Does not bruise/bleed easily.  Psychiatric/Behavioral: Negative for dysphoric mood. The  patient is not nervous/anxious.         Objective:   Physical Exam  Constitutional: He appears well-developed and well-nourished.  HENT:  Head: Normocephalic and atraumatic.  Right Ear: External ear normal.  Mouth/Throat: No oropharyngeal exudate.  L ear-total cerumen impaction- improved with simple irrigation   Eyes: Conjunctivae and EOM are normal. Pupils are equal, round, and reactive to light. No scleral icterus.  Neck: Normal range of motion. Neck supple. No JVD present. Carotid bruit is not present. No tracheal deviation present. No thyromegaly present.  Cardiovascular: Normal rate and regular rhythm.   Pulmonary/Chest: No respiratory distress. He has wheezes. He has no rales. He exhibits no tenderness.  Diffusely distant bs   Abdominal: He exhibits no abdominal bruit.  Lymphadenopathy:    He has no cervical adenopathy.  Neurological: He is alert. He has normal reflexes.  Skin: Skin is warm and dry. No rash noted. No erythema. No pallor.  Psychiatric: He has a normal mood and affect.          Assessment & Plan:

## 2013-05-18 NOTE — Assessment & Plan Note (Signed)
Simple irrigation L ear with relief Disc use of debrox retularly

## 2013-05-18 NOTE — Assessment & Plan Note (Signed)
Exam and symptoms are imp with cutting down so far on smoking  Will try spiriva for treatment Long disc re: quitting smoking  F/u planned  Disc tech for use

## 2013-05-18 NOTE — Assessment & Plan Note (Signed)
Disc in detail risks of smoking and possible outcomes including copd, vascular/ heart disease, cancer , respiratory and sinus infections  Pt voices understanding Commended on cutting down so far with chantix with intention to quit  Wife is supportive  F/u planned

## 2013-07-11 ENCOUNTER — Other Ambulatory Visit: Payer: Self-pay | Admitting: Family Medicine

## 2013-07-18 ENCOUNTER — Ambulatory Visit (INDEPENDENT_AMBULATORY_CARE_PROVIDER_SITE_OTHER): Payer: Medicare Other | Admitting: Family Medicine

## 2013-07-18 ENCOUNTER — Encounter: Payer: Self-pay | Admitting: Family Medicine

## 2013-07-18 VITALS — BP 118/58 | HR 81 | Temp 98.7°F | Ht 68.0 in | Wt 142.2 lb

## 2013-07-18 DIAGNOSIS — Z23 Encounter for immunization: Secondary | ICD-10-CM

## 2013-07-18 DIAGNOSIS — J449 Chronic obstructive pulmonary disease, unspecified: Secondary | ICD-10-CM

## 2013-07-18 DIAGNOSIS — F17200 Nicotine dependence, unspecified, uncomplicated: Secondary | ICD-10-CM

## 2013-07-18 NOTE — Patient Instructions (Addendum)
Continue spiriva as directed  Keep trying to quit smoking  Stay active  Flu shot today

## 2013-07-18 NOTE — Progress Notes (Signed)
Subjective:    Patient ID: Carl Mcconnell, male    DOB: May 03, 1933, 79 y.o.   MRN: [id number]  HPI Here for f/u of copd and also smoking   Using the spiriva  It does help his breathing  Not wheezing at all  His exercise tolerance is considerably better - can walk to the barn not as out of breath   Wt is up 2 lb   chantix did not work out - just not working  He may try an otc nicotine product  His will power is fair The force of habit is difficult    Patient Active Problem List   Diagnosis Date Noted  . Other malaise and fatigue 04/28/2013  . Decreased appetite 04/28/2013  . Dyspnea 04/28/2013  . Cerumen impaction 04/28/2013  . Cholecystitis 10/04/2012  . ERECTILE DYSFUNCTION, ORGANIC 10/18/2010  . ANEMIA, IRON DEFICIENCY 09/27/2010  . BLOOD IN STOOL, OCCULT 09/27/2010  . CERUMEN IMPACTION, BILATERAL 09/26/2010  . INGUINAL HERNIA 09/26/2010  . UNSPECIFIED ANEMIA 09/22/2010  . TOBACCO ABUSE 09/14/2009  . COPD 07/22/2008  . SEBACEOUS CYST 07/22/2008  . COLONIC POLYPS, ADENOMATOUS, HX OF 07/22/2008  . HYPOTHYROIDISM 07/04/2007  . 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 DEGENERATIVE DISC DISEASE, LUMBAR SPINE 07/04/2007   Past Medical History  Diagnosis Date  . Hypothyroidism   . Colon polyp   . Anemia   . ED (erectile dysfunction)   . COPD (chronic obstructive pulmonary disease)   . Shortness of breath   . Stroke     vision trouble  . Degenerative disc disease     LS   Past Surgical History  Procedure Laterality Date  . Hernia repair  1998  . Eye surgery  2000    left eye  . Rectal abscess  10/2002    foreign body  . No past surgeries       BILIARY TUBE PLACED RT SIDE  . Cholecystectomy  10/29/2012    Procedure: LAPAROSCOPIC CHOLECYSTECTOMY WITH INTRAOPERATIVE CHOLANGIOGRAM;  Surgeon: Clovis Pu. Cornett, MD;  Location: [facility];  Service: General;  Laterality: N/A;  laparoscopic cholecystectomy with intraoperative cholangiogram   History  Substance Use Topics  . Smoking  status: Current Every Day Smoker -- 1.00 packs/day for 64 years    Types: Cigarettes  . Smokeless tobacco: Not on file     Comment: tried chantix but cannot use it/not sure about OTC meds for smoking cessation   . Alcohol Use: No   Family History  Problem Relation Age of Onset  . Heart disease Mother     CAD  . Stroke Father   . Diabetes Sister   . Hypertension Sister   . Heart disease Brother     MI   Allergies  Allergen Reactions  . Chantix [Varenicline]     REACTION: ---in HIGH dosage (patient can take lower dose) Caused eyes out of focus, h/a,nausea and achiness    Current Outpatient Prescriptions on File Prior to Visit  Medication Sig Dispense Refill  . aspirin 81 MG tablet Take 81 mg by mouth daily.       . Cyanocobalamin (VITAMIN B 12 PO) Take 1 tablet by mouth daily.      Marland Kitchen SYNTHROID 150 MCG tablet TAKE 1 TABLET EVERY DAY  90 tablet  0  . tiotropium (SPIRIVA HANDIHALER) 18 MCG inhalation capsule Place 1 capsule (18 mcg total) into inhaler and inhale daily.  30 capsule  11  . varenicline (CHANTIX) 1 MG tablet Take 1 tablet (1  mg total) by mouth 2 (two) times daily.  30 tablet  3   No current facility-administered medications on file prior to visit.    Review of Systems Review of Systems  Constitutional: Negative for fever, appetite change, fatigue and unexpected weight change.  Eyes: Negative for pain and visual disturbance.  Respiratory: Negative for cough and pos for sob on exertion that is improved   Cardiovascular: Negative for cp or palpitations    Gastrointestinal: Negative for nausea, diarrhea and constipation.  Genitourinary: Negative for urgency and frequency.  Skin: Negative for pallor or rash   Neurological: Negative for weakness, light-headedness, numbness and headaches.  Hematological: Negative for adenopathy. Does not bruise/bleed easily.  Psychiatric/Behavioral: Negative for dysphoric mood. The patient is not nervous/anxious.         Objective:    Physical Exam  Constitutional: He appears well-developed and well-nourished. No distress.  HENT:  Head: Normocephalic and atraumatic.  Mouth/Throat: Oropharynx is clear and moist.  Eyes: Conjunctivae and EOM are normal. Pupils are equal, round, and reactive to light. No scleral icterus.  Neck: Normal range of motion. Neck supple.  Cardiovascular: Normal rate, regular rhythm and intact distal pulses.   Pulmonary/Chest: Effort normal and breath sounds normal. No respiratory distress. He has no wheezes. He has no rales. He exhibits no tenderness.  Diffusely distant bs   Abdominal: Soft. Bowel sounds are normal.  Lymphadenopathy:    He has no cervical adenopathy.  Neurological: He is alert. He has normal reflexes.  Skin: Skin is warm and dry. No rash noted. No pallor.  Psychiatric: He has a normal mood and affect.          Assessment & Plan:

## 2013-07-20 NOTE — Assessment & Plan Note (Signed)
Disc in detail risks of smoking and possible outcomes including copd, vascular/ heart disease, cancer , respiratory and sinus infections  Pt voices understanding  He failed chantix Is interested in trying nicotine product otc  Disc pros and cons of different products

## 2013-07-20 NOTE — Assessment & Plan Note (Signed)
Improved sob and wheezing on spiriva Unable to quit smoking so far but still working on it

## 2013-09-08 ENCOUNTER — Encounter: Payer: Self-pay | Admitting: Internal Medicine

## 2013-09-15 ENCOUNTER — Encounter: Payer: Self-pay | Admitting: Internal Medicine

## 2013-10-06 ENCOUNTER — Other Ambulatory Visit: Payer: Self-pay | Admitting: *Deleted

## 2013-10-06 MED ORDER — LEVOTHYROXINE SODIUM 150 MCG PO TABS: ORAL_TABLET | ORAL | Status: DC

## 2013-10-08 ENCOUNTER — Other Ambulatory Visit: Payer: Self-pay | Admitting: *Deleted

## 2013-10-08 MED ORDER — LEVOTHYROXINE SODIUM 150 MCG PO TABS: ORAL_TABLET | ORAL | Status: DC

## 2013-11-25 ENCOUNTER — Ambulatory Visit (AMBULATORY_SURGERY_CENTER): Payer: Self-pay | Admitting: *Deleted

## 2013-11-25 VITALS — Ht 68.0 in | Wt 146.2 lb

## 2013-11-25 DIAGNOSIS — Z860100 Personal history of colon polyps, unspecified: Secondary | ICD-10-CM

## 2013-11-25 MED ORDER — NA SULFATE-K SULFATE-MG SULF 17.5-3.13-1.6 GM/177ML PO SOLN: 1.0000 | Freq: Once | ORAL | Status: DC

## 2013-11-25 NOTE — Progress Notes (Signed)
No allergies to eggs or soy. No problems with anesthesia.  

## 2013-12-03 ENCOUNTER — Encounter: Payer: Self-pay | Admitting: Internal Medicine

## 2013-12-09 ENCOUNTER — Ambulatory Visit (AMBULATORY_SURGERY_CENTER): Payer: Medicare Other | Admitting: Internal Medicine

## 2013-12-09 ENCOUNTER — Encounter: Payer: Self-pay | Admitting: Internal Medicine

## 2013-12-09 VITALS — BP 117/88 | HR 66 | Temp 96.7°F | Resp 15 | Ht 66.5 in | Wt 154.0 lb

## 2013-12-09 DIAGNOSIS — Z860100 Personal history of colon polyps, unspecified: Secondary | ICD-10-CM

## 2013-12-09 DIAGNOSIS — D126 Benign neoplasm of colon, unspecified: Secondary | ICD-10-CM

## 2013-12-09 DIAGNOSIS — K5730 Diverticulosis of large intestine without perforation or abscess without bleeding: Secondary | ICD-10-CM

## 2013-12-09 MED ORDER — SODIUM CHLORIDE 0.9 % IV SOLN: 500.0000 mL | INTRAVENOUS | Status: DC

## 2013-12-09 NOTE — Progress Notes (Signed)
Report to pacu rn, vss, bbs=clear 

## 2013-12-09 NOTE — Progress Notes (Signed)
No egg or soy allergy. ewm No problems with past sedation. ewm

## 2013-12-09 NOTE — Patient Instructions (Addendum)
There was a large flat polyp in the colon that I could only remove part of. Three tiny polyps were removed completely. There was a possible abnormal area of blood vessels called an AVM - I destroyed that and you also have diverticulosis.  I will call to discuss results of the polyp removal and what to do next.  I appreciate the opportunity to care for you. Gatha Mayer, MD, FACG  YOU HAD AN ENDOSCOPIC PROCEDURE TODAY AT [facility]: Refer to the procedure report that was given to you for any specific questions about what was found during the examination.  If the procedure report does not answer your questions, please call your gastroenterologist to clarify.  If you requested that your care partner not be given the details of your procedure findings, then the procedure report has been included in a sealed envelope for you to review at your convenience later.  YOU SHOULD EXPECT: Some feelings of bloating in the abdomen. Passage of more gas than usual.  Walking can help get rid of the air that was put into your GI tract during the procedure and reduce the bloating. If you had a lower endoscopy (such as a colonoscopy or flexible sigmoidoscopy) you may notice spotting of blood in your stool or on the toilet paper. If you underwent a bowel prep for your procedure, then you may not have a normal bowel movement for a few days.  DIET: Your first meal following the procedure should be a light meal and then it is ok to progress to your normal diet.  A half-sandwich or bowl of soup is an example of a good first meal.  Heavy or fried foods are harder to digest and may make you feel nauseous or bloated.  Likewise meals heavy in dairy and vegetables can cause extra gas to form and this can also increase the bloating.  Drink plenty of fluids but you should avoid alcoholic beverages for 24 hours.  ACTIVITY: Your care partner should take you home directly after the procedure.  You should plan to take  it easy, moving slowly for the rest of the day.  You can resume normal activity the day after the procedure however you should NOT DRIVE or use heavy machinery for 24 hours (because of the sedation medicines used during the test).    SYMPTOMS TO REPORT IMMEDIATELY: A gastroenterologist can be reached at any hour.  During normal business hours, 8:30 AM to 5:00 PM Monday through Friday, call [phone].  After hours and on weekends, please call the GI answering service at [phone] who will take a message and have the physician on call contact you.   Following lower endoscopy (colonoscopy or flexible sigmoidoscopy):  Excessive amounts of blood in the stool  Significant tenderness or worsening of abdominal pains  Swelling of the abdomen that is new, acute  Fever of 100F or higher   FOLLOW UP: If any biopsies were taken you will be contacted by phone or by letter within the next 1-3 weeks.  Call your gastroenterologist if you have not heard about the biopsies in 3 weeks.  Our staff will call the home number listed on your records the next business day following your procedure to check on you and address any questions or concerns that you may have at that time regarding the information given to you following your procedure. This is a courtesy call and so if there is no answer at the home number and we  have not heard from you through the emergency physician on call, we will assume that you have returned to your regular daily activities without incident.  SIGNATURES/CONFIDENTIALITY: You and/or your care partner have signed paperwork which will be entered into your electronic medical record.  These signatures attest to the fact that that the information above on your After Visit Summary has been reviewed and is understood.  Full responsibility of the confidentiality of this discharge information lies with you and/or your care-partner.  Recommendations Hold aspirin, aspirin products, and  anti-inflammatory medication for 2 weeks. Await pathology results Office will call with the results-options will be surgical resection vs. repeat colonoscopies

## 2013-12-09 NOTE — Progress Notes (Signed)
Called to room to assist during endoscopic procedure.  Patient ID and intended procedure confirmed with present staff. Received instructions for my participation in the procedure from the performing physician.  

## 2013-12-09 NOTE — Op Note (Signed)
[place]  [address] [place], [id number]   COLONOSCOPY PROCEDURE REPORT  PATIENT: Carl, Mcconnell  MR#: [id number] BIRTHDATE: 1933-04-24 , 80  yrs. old GENDER: Male ENDOSCOPIST: Gatha Mayer, MD, [facility] REFERRED BY: PROCEDURE DATE:  12/09/2013 PROCEDURE:   Colonoscopy with biopsy and snare polypectomy and Submucosal injection, any substance First Screening Colonoscopy - Avg.  risk and is 50 yrs.  old or older - No.  Prior Negative Screening - Now for repeat screening. N/A  History of Adenoma - Now for follow-up colonoscopy & has been > or = to 3 yrs.  Yes hx of adenoma.  Has been 3 or more years since last colonoscopy.  Polyps Removed Today? Yes. ASA CLASS:   Class II INDICATIONS:Patient's personal history of adenomatous colon polyps.  MEDICATIONS: Propofol (Diprivan) 240 mg IV, MAC sedation, administered by CRNA, and These medications were titrated to patient response per physician's verbal order  DESCRIPTION OF PROCEDURE:   After the risks benefits and alternatives of the procedure were thoroughly explained, informed consent was obtained.  A digital rectal exam revealed no abnormalities of the rectum.   The LB [id number] and LB [id number]  endoscope was introduced through the anus and advanced to the cecum, which was identified by both the appendix and ileocecal valve. No adverse events experienced.   The quality of the prep was excellent using Suprep  The instrument was then slowly withdrawn as the colon was fully examined.      COLON FINDINGS: A large polypoid shaped flat polyp was found at the cecum. 2+ cm.  Endoscopic mucosal resection was performed in a piecemeal fashion by injecting saline and methylene blue into the submucosa to raise the lesion and polypectomy was performed with snare cautery.  The resection was incomplete and the polyp tissue was completely retrieved.  The polyp would lift but then the lift dissipated and was  difficult to repeat. Multiple snare types failed to yield consistent polypectomy so I stopped. Some tip cauyert used at edges of sites and also to ablate a ? small AVM at edge of polypectomy area.  Three diminutive sessile polyps were found at the appendiceal orifice, splenic flexure, and in the rectum.  A polypectomy was performed with cold forceps and with a cold snare. The resection was complete and the polyp tissue was completely retrieved.   Severe diverticulosis was noted in the sigmoid colon. The colon mucosa was otherwise normal.   A right colon retroflexion was performed.  Retroflexed views revealed no abnormalities. The time to cecum=4 minutes 10 seconds.  Withdrawal time=46 minutes 39 seconds.  The scope was withdrawn and the procedure completed. COMPLICATIONS: There were no complications.  ENDOSCOPIC IMPRESSION: 1.   Large flat polyp was found at the cecum; endoscopic mucosal resection was performed - partially removed 2.   Three diminutive sessile polyps were found at the appendiceal orifice, splenic flexure, and in the rectum; polypectomy was performed with cold forceps and with a cold snare 3.   Possible AVM - cecum - ablated 4.   Severe diverticulosis was noted in the sigmoid colon 5.   The colon mucosa was otherwise normal - excellent prep  RECOMMENDATIONS: 1.  Hold aspirin, aspirin products, and anti-inflammatory medication for 2 weeks. 2.  Await pathology results 3.  Office will call with the results. - Options will be surgical resection vs. repeat colonoscopies.   eSigned:  Gatha Mayer, MD, [facility] 12/09/2013 11:34 AM   cc: The Patient and  Abner Greenspan, MD   PATIENT NAME:  Carl, Mcconnell MR#: [id number]

## 2013-12-10 ENCOUNTER — Telehealth: Payer: Self-pay | Admitting: *Deleted

## 2013-12-10 NOTE — Telephone Encounter (Signed)
  Follow up Call-  Call back number 12/09/2013  Post procedure Call Back phone  # [phone]  Permission to leave phone message Yes     Patient questions:  Do you have a fever, pain , or abdominal swelling? no Pain Score  0 *  Have you tolerated food without any problems? yes  Have you been able to return to your normal activities? yes  Do you have any questions about your discharge instructions: Diet   no Medications  no Follow up visit  no  Do you have questions or concerns about your Care? no  Actions: * If pain score is 4 or above: No action needed, pain <4.  Pt. Still asleep, pt. Wife stated he did fine no problems,he's doing fine.

## 2013-12-18 ENCOUNTER — Encounter: Payer: Self-pay | Admitting: Internal Medicine

## 2013-12-18 DIAGNOSIS — D126 Benign neoplasm of colon, unspecified: Secondary | ICD-10-CM

## 2013-12-18 HISTORY — DX: Benign neoplasm of colon, unspecified: D12.6

## 2013-12-18 NOTE — Progress Notes (Signed)
Quick Note:  I called results of benign polyps to patient.  Office:  One polyp flat and large and will be difficult if not impossible to remove w/ scope. Please make an appointment for him to see Dr. Brantley Stage re: cecal polyp - patient aware we will do this  [facility]: 1 year colon recall No letter ______

## 2013-12-19 ENCOUNTER — Other Ambulatory Visit: Payer: Self-pay

## 2013-12-19 DIAGNOSIS — Z86010 Personal history of colonic polyps: Secondary | ICD-10-CM

## 2014-01-05 ENCOUNTER — Encounter (INDEPENDENT_AMBULATORY_CARE_PROVIDER_SITE_OTHER): Payer: Self-pay | Admitting: Surgery

## 2014-01-05 ENCOUNTER — Ambulatory Visit (INDEPENDENT_AMBULATORY_CARE_PROVIDER_SITE_OTHER): Payer: Medicare Other | Admitting: Surgery

## 2014-01-05 VITALS — BP 118/80 | HR 80 | Temp 98.3°F | Resp 14 | Ht 68.0 in | Wt 138.8 lb

## 2014-01-05 DIAGNOSIS — K6389 Other specified diseases of intestine: Secondary | ICD-10-CM

## 2014-01-05 NOTE — Progress Notes (Signed)
Patient ID: Carl Mcconnell, male   DOB: 1933-07-17, 80 y.o.   MRN: [id number]  Chief Complaint  Patient presents with  . New Evaluation    Eval colon per Dr. Carlean Mcconnell    HPI AMALIO Mcconnell is a 80 y.o. male.  Patient sent at request of Dr. Carlean Mcconnell for large polyp involving the cecum which is flat. The patient underwent colonoscopy with multiple polypectomy. A large sessile polyp was encountered in the cecum and was not amenable to resection. He is sent today to discuss partial colectomy for removal. Denies any abdominal pain, blood in stool or significant weight loss. He is a heavy smoker. He is able to walk to his mailbox and back but this does wind him. He does not eat well according to his wife. HPI  Past Medical History  Diagnosis Date  . Hypothyroidism   . Colon polyp   . Anemia   . ED (erectile dysfunction)   . COPD (chronic obstructive pulmonary disease)   . Shortness of breath   . Stroke     vision trouble  . Degenerative disc disease     LS  . Cholecystitis 10/04/2012  . 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 Hernia repair Left 1998  . Eye surgery  2000    left eye; muscle surgery  . Rectal abscess  10/2002    foreign body  . Cholecystectomy  10/29/2012    Procedure: LAPAROSCOPIC CHOLECYSTECTOMY WITH INTRAOPERATIVE CHOLANGIOGRAM;  Surgeon: Joyice Faster. Chandel Zaun, MD;  Location: [place];  Service: General;  Laterality: N/A;  laparoscopic cholecystectomy with intraoperative cholangiogram  . Colonoscopy  multiple  . Esophagogastroduodenoscopy    . Ct perc cholecystostomy  2013    Family History  Problem Relation Age of Onset  . Heart disease Mother     CAD  . Stroke Father   . Diabetes Sister   . Hypertension Sister   . Heart disease Brother     MI  . Colon cancer Neg Hx     Social History History  Substance Use Topics  . Smoking status: Current Every Day Smoker -- 1.00 packs/day for 64 years     Types: Cigarettes  . Smokeless tobacco: Never Used     Comment: tried chantix but cannot use it/not sure about OTC meds for smoking cessation   . Alcohol Use: No    Allergies  Allergen Reactions  . Chantix [Varenicline]     REACTION: ---in HIGH dosage (patient can take lower dose) Caused eyes out of focus, h/a,nausea and achiness     Current Outpatient Prescriptions  Medication Sig Dispense Refill  . aspirin 81 MG tablet Take 81 mg by mouth daily.       Marland Kitchen BESIVANCE 0.6 % SUSP       . Cyanocobalamin (VITAMIN B 12 PO) Take 1 tablet by mouth daily.      . DUREZOL 0.05 % EMUL       . ILEVRO 0.3 % SUSP       . IRON PO Take by mouth. Takes 1 tablet every other day      . levothyroxine (SYNTHROID) 150 MCG tablet TAKE 1 TABLET EVERY DAY  90 tablet  1  . tiotropium (SPIRIVA HANDIHALER) 18 MCG inhalation capsule Place 1 capsule (18 mcg total) into inhaler and inhale daily.  30 capsule  11   No current facility-administered medications for this visit.    Review  of Systems Review of Systems  Constitutional: Positive for fatigue. Negative for unexpected weight change.  HENT: Negative.   Respiratory: Positive for cough and shortness of breath.   Cardiovascular: Negative.   Gastrointestinal: Negative.   Endocrine: Negative.   Genitourinary: Negative.   Musculoskeletal: Negative.   Skin: Negative.   Allergic/Immunologic: Negative.   Neurological: Negative.   Hematological: Negative.   Psychiatric/Behavioral: Negative.     Blood pressure 118/80, pulse 80, temperature 98.3 F (36.8 C), temperature source Temporal, resp. rate 14, height 5\' 8"  (1.727 m), weight 138 lb 12.8 oz (62.959 kg).  Physical Exam Physical Exam  Constitutional: He is oriented to person, place, and time. He appears well-developed and well-nourished.  HENT:  Head: Normocephalic and atraumatic.  Eyes: EOM are normal. Pupils are equal, round, and reactive to light. No scleral icterus.  Neck: Normal range of  motion. Neck supple.  Cardiovascular: Normal rate and regular rhythm.   Pulmonary/Chest: Effort normal and breath sounds normal. He has no rales.  Abdominal: Soft. Bowel sounds are normal. He exhibits no distension. There is no tenderness. There is no rebound.  Musculoskeletal: Normal range of motion.  Neurological: He is alert and oriented to person, place, and time.  Skin: Skin is warm and dry.  Psychiatric: He has a normal mood and affect. His behavior is normal. Judgment and thought content normal.    Data Reviewed Colonoscopy report   Assessment    Large sessile polyp cecum not amenable to endoscopic resection    Plan    Recommend laparoscopic assisted partial colectomy. Patient will require preoperative cardiac and pulmonary clearance since he has significant shortness of breath with minimal physical exertion but none at rest.The procedure was discussed with the patient.  Laparoscopic partial colectomy discussed with the patient as well as non operative treatments. The risks of operative management include bleeding,  Infection,  Leak of anastamosis,  Ostomy formation, open procedure,  Sepsis,  Abcess,  Hernia,  DVT,  Pulmonary complications,  Cardiovascular  complications,  Injury to ureter,  Bladder,kidney,and anesthesia risks,  And death. The patient understands.  Questions answered.   The success of the procedure is 50 100 % for treating the patients symptoms. They agree to proceed.    Patient Active Problem List   Diagnosis Date Noted  . 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 Other malaise and fatigue 04/28/2013  . Decreased appetite 04/28/2013  . Dyspnea 04/28/2013  . ERECTILE DYSFUNCTION, ORGANIC 10/18/2010  . INGUINAL HERNIA 09/26/2010  . TOBACCO ABUSE 09/14/2009  . COPD 07/22/2008  . SEBACEOUS CYST 07/22/2008  . COLONIC POLYPS, ADENOMATOUS, HX OF 07/22/2008  . HYPOTHYROIDISM 07/04/2007  . 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 DEGENERATIVE DISC DISEASE,  LUMBAR SPINE 07/04/2007   Discussed increase complications rate with tobacco use and exacerbation of underlying medical problems.  Carl Mostafa A. 01/05/2014, 12:37 PM

## 2014-01-05 NOTE — Patient Instructions (Signed)
Laparoscopic Colectomy Laparoscopic colectomy is surgery to remove part or all of the large intestine (colon). This procedure is used to treat several conditions, including:  Inflammation and infection of the colon (diverticulitis).  Tumors or masses in the colon.  Inflammatory bowel disease, such as Crohn disease or ulcerative colitis. Colectomy is an option when symptoms cannot be controlled with medicines.  Bleeding from the colon that cannot be controlled by another method.  Blockage or obstruction of the colon. LET [facility] CARE PROVIDER KNOW ABOUT:  Any allergies you have.  All medicines you are taking, including vitamins, herbs, eye drops, creams, and over-the-counter medicines.  Previous problems you or members of your family have had with the use of anesthetics.  Any blood disorders you have.  Previous surgeries you have had.  Medical conditions you have. RISKS AND COMPLICATIONS Generally, this is a safe procedure. However, as with any procedure, complications can occur. Possible complications include:  Infection.  Bleeding.  Damage to other organs.  Leaking from where the colon was sewn together.  Future blockage of the small intestines from scar tissue. Another surgery may be needed to repair this. In some cases, complications such as damage to other organs or excessive bleeding may require the surgeon to convert from a laparoscopic procedure to an open procedure. This involves making a larger incision in the abdomen to perform the procedure. BEFORE THE PROCEDURE  Ask your health care provider about changing or stopping any regular medicines.  You may be prescribed an oral bowel prep. This involves drinking a large amount of medicated liquid, starting the day before your surgery. The liquid will cause you to have multiple loose stools until your stool is almost clear or light green. This cleans out your colon in preparation for the surgery.  Do not eat or  drink anything else once you have started the bowel prep, unless your health care provider tells you it is safe to do so.  You may also be given antibiotic pills to clean out your colon of bacteria. Be sure to follow the directions carefully and take the medicine at the correct time. PROCEDURE   Small monitors will be put on your body. They are used to check your heart, blood pressure, and oxygen level.  An IV access tube will be put into one of your veins. Medicine will be able to flow directly into your body through this IV tube.  You might be given a medicine to help you relax (sedative).  You will be given a medicine to make you sleep through the procedure (general anesthetic). A breathing tube may be placed into your lungs during the procedure.  A thin, flexible tube (catheter) will be placed into your bladder to collect urine.  A tube may be put in through your nose. It is called a nasogastric tube. It is used to remove stomach fluids after surgery until the intestines start working again.  Your abdomen will be filled with air so that it expands. This gives the surgeon more room to operate and makes your organs easier to see.  Several small cuts (incisions) are made in your abdomen.  A thin, lighted tube with a tiny camera on the end (laparoscope) is put through one of the small incisions. The camera on the laparoscope sends a picture to a TV screen in the operating room. This gives the surgeon a good view inside your abdomen.  Hollow tubes are put through the other small incisions in your abdomen. The tools  needed for the procedure are put through these tubes.  Clamps or staples are put on both ends of the diseased part of the colon.  The part of the intestine between the clamps or staples is removed.  If possible, the ends of the healthy colon that remain will be stitched or stapled together to allow your body to expel waste (stool).  Sometimes, the remaining colon cannot be  stitched back together. If this is the case, a colostomy is needed. For a colostomy:  An opening (stoma) to the outside of your body is made through the abdomen.  The end of the colon is brought to the opening. It is stitched to the skin.  A bag is attached to the opening. Stool will drain into this bag. The bag is removable.  The colostomy can be temporary or permanent.  The incisions from the colectomy are closed with stitches or staples. AFTER THE PROCEDURE  You will be monitored closely in a recovery area until you are stable and doing well. You will then be moved to a regular hospital room.  You will need to receive fluids through an IV tube until your bowel function has returned. This may take 1 3 days. Once your bowels are working again, you will be started on clear liquids and then advanced to solid food as tolerated.  You will be given pain medicines to control your pain. Document Released: 12/23/2002 Document Revised: 07/23/2013 Document Reviewed: 05/14/2013 [employer] Patient Information 2014 [employer].  

## 2014-01-06 ENCOUNTER — Telehealth (INDEPENDENT_AMBULATORY_CARE_PROVIDER_SITE_OTHER): Payer: Self-pay | Admitting: *Deleted

## 2014-01-06 NOTE — Telephone Encounter (Signed)
Spoke with pt to let him know that the PCP on his [place] [employer] Access card needs to be changed. [facility] Jeanett Schlein is not willing to give NPI# since pt hasn't been seen since 05/2012. Pt states he will call his caseworker and see what he can do to get it fixed.  I advised pt as soon as we are aware that this has taken place, then we can move forward with the recommendations Dr. Brantley Stage has made as far as sending pt to a Pulmonologist and Cardiologist. Will forward this message to make Dr. Brantley Stage aware.Reola Mosher

## 2014-01-27 ENCOUNTER — Ambulatory Visit (INDEPENDENT_AMBULATORY_CARE_PROVIDER_SITE_OTHER): Payer: Medicare Other | Admitting: Internal Medicine

## 2014-01-27 ENCOUNTER — Encounter: Payer: Self-pay | Admitting: Internal Medicine

## 2014-01-27 VITALS — BP 104/62 | HR 64 | Temp 97.4°F | Ht 68.0 in | Wt 139.8 lb

## 2014-01-27 DIAGNOSIS — J449 Chronic obstructive pulmonary disease, unspecified: Secondary | ICD-10-CM

## 2014-01-27 DIAGNOSIS — F17200 Nicotine dependence, unspecified, uncomplicated: Secondary | ICD-10-CM

## 2014-01-27 MED ORDER — ACLIDINIUM BROMIDE 400 MCG/ACT IN AEPB: 1.0000 | INHALATION_SPRAY | Freq: Two times a day (BID) | RESPIRATORY_TRACT | Status: DC

## 2014-01-27 NOTE — Assessment & Plan Note (Addendum)
-   spirometry 01/27/2014 >  FEV1  0.77 (25%) ratio 42   DDX of  difficult airways managment all start with A and  include Adherence, Ace Inhibitors, Acid Reflux, Active Sinus Disease, Alpha 1 Antitripsin deficiency, Anxiety masquerading as Airways dz,  ABPA,  allergy(esp in young), Aspiration (esp in elderly), Adverse effects of DPI,  Active smokers, plus two Bs  = Bronchiectasis and Beta blocker use..and one C= CHF  Adherence is always the initial "prime suspect" and is a multilayered concern that requires a "trust but verify" approach in every patient - starting with knowing how to use medications, especially inhalers, correctly, keeping up with refills and understanding the fundamental difference between maintenance and prns vs those medications only taken for a very short course and then stopped and not refilled.  The proper method of use, as well as anticipated side effects, of a metered-dose inhaler are discussed and demonstrated to the patient. Improved effectiveness after extensive coaching during this visit to a level of approximately  90% with tudorza, try this instead of spiriva as some of his gi symptoms may be related to anticholinergic side effects  Active smoking > discussed separately   Not clear the benefits of surgery outway the potential side effects > will review cxr and discuss with Drs Carlean Purl and Cornett.

## 2014-01-27 NOTE — Progress Notes (Addendum)
   Subjective:    Patient ID: Carl Mcconnell, male    DOB: 1933-01-26   MRN: [id number]  HPI  28 yowm smoker with dx of copd requiring colon surgery and requested pulmonary eval for  01/27/2014  for surgical clearance by Dr Brantley Stage.   01/27/2014 1st [facility] office visit/ Carl Mcconnell / active smoker/ maint on spiriva longterm Chief Complaint  Patient presents with  . Pulmonary Consult    Referred per Dr. Olevia Perches for pulmonary clearance for colon surgery. Pt states dxed with COPD approx 4 yrs ago.  He c/o DOE progressively worse for the past month. He also c/o cough- prod with yellow sputum.  sob indolent onset progressive to point where only x 50 ft - has to sit down on porch and recover after walking to  mail box  abd swelling / freq bms/ can't breath when bends over or belt tight Cough worse in am's All labs and xrays/ekgs at [facility] per pt > requested  No obvious other patterns in day to day or daytime variabilty or assoc chronic cough or cp or chest tightness, subjective wheeze overt sinus or hb symptoms. No unusual exp hx or h/o childhood pna/ asthma or knowledge of premature birth.  Sleeping ok without nocturnal  or early am exacerbation  of respiratory  c/o's or need for noct saba. Also denies any obvious fluctuation of symptoms with weather or environmental changes or other aggravating or alleviating factors except as outlined above   Current Medications, Allergies, Complete Past Medical History, Past Surgical History, Family History, and Social History were reviewed in [employer] record.               Review of Systems  Constitutional: Positive for appetite change and unexpected weight change. Negative for fever, chills and activity change.  HENT: Positive for congestion. Negative for dental problem, postnasal drip, rhinorrhea, sneezing, sore throat, trouble swallowing and voice change.   Eyes: Negative for visual disturbance.  Respiratory:  Positive for cough and shortness of breath. Negative for choking.   Cardiovascular: Negative for chest pain and leg swelling.  Gastrointestinal: Negative for nausea, vomiting and abdominal pain.  Genitourinary: Negative for difficulty urinating.  Musculoskeletal: Negative for arthralgias.  Skin: Negative for rash.  Psychiatric/Behavioral: Negative for behavioral problems and confusion.       Objective:   Physical Exam  Wt Readings from Last 3 Encounters:  01/27/14 139 lb 12.8 oz (63.413 kg)  01/05/14 138 lb 12.8 oz (62.959 kg)  12/09/13 154 lb (69.854 kg)      HEENT mild turbinate edema.  Oropharynx no thrush or excess pnd or cobblestoning.  No JVD or cervical adenopathy. Mild accessory muscle hypertrophy. Trachea midline, nl thryroid. Chest was hyperinflated by percussion with diminished breath sounds and moderate increased exp time without wheeze. Hoover sign positive at mid inspiration. Regular rate and rhythm without murmur gallop or rub or increase P2 or edema.  Abd: no hsm, nl excursion. Ext warm without cyanosis or clubbing.     Labs 01/07/14 nl cbc, tsh, cmet (except alk phos 147  cxr 01/07/14 copd with marked hyperinflation but not acute process     Assessment & Plan:

## 2014-01-27 NOTE — Patient Instructions (Addendum)
I will obtain [facility] records and discuss with Drs Cornett why you may be loosing wt and what the risks of the surgery may be   In meantime you need to: 1) Stop spiriva and start tudorza one twice daily   2)Stop all smoking - this is the most important aspect of your care   Return to clinic in 2 weeks to see if we can clear you for surgery

## 2014-01-27 NOTE — Assessment & Plan Note (Signed)

## 2014-02-05 ENCOUNTER — Ambulatory Visit (INDEPENDENT_AMBULATORY_CARE_PROVIDER_SITE_OTHER): Payer: Medicare Other | Admitting: Cardiovascular Disease

## 2014-02-05 ENCOUNTER — Encounter: Payer: Self-pay | Admitting: Cardiovascular Disease

## 2014-02-05 VITALS — BP 140/90 | HR 85 | Ht 68.0 in | Wt 140.0 lb

## 2014-02-05 DIAGNOSIS — Z01810 Encounter for preprocedural cardiovascular examination: Secondary | ICD-10-CM

## 2014-02-05 DIAGNOSIS — R0600 Dyspnea, unspecified: Secondary | ICD-10-CM

## 2014-02-05 DIAGNOSIS — R0609 Other forms of dyspnea: Secondary | ICD-10-CM

## 2014-02-05 DIAGNOSIS — R0989 Other specified symptoms and signs involving the circulatory and respiratory systems: Secondary | ICD-10-CM

## 2014-02-05 DIAGNOSIS — I6789 Other cerebrovascular disease: Secondary | ICD-10-CM

## 2014-02-05 DIAGNOSIS — R0602 Shortness of breath: Secondary | ICD-10-CM

## 2014-02-05 NOTE — Assessment & Plan Note (Signed)
The patient has COPD. He complains of increasing dyspnea on exertion. He does have cardiac risk factors notable for 60 pack years of tobacco abuse and family history. Based on this and the fact that he needs to undergo abdominal surgery, I'm going to get a 2-D echocardiogram and a pharmacologic Myoview stress test to risk stratify him. If these are normal I would clear him for his upcoming surgery.

## 2014-02-05 NOTE — Progress Notes (Signed)
02/05/2014 AMMIEL Mcconnell   01-Jul-1933  [id number]  Primary Physician No PCP Per Patient Primary Cardiologist: Lorretta Harp MD [name]   HPI:  Mr. Carl Mcconnell is a 80 -year-old Caucasian male who presents today with his significant other of 30 years. He has 2 children and 3 grandchildren. He was referred for preoperative clearance before yet to be scheduled abdominal surgery for partial colectomy because of a polyp. Past cardiovascular history profile is remarkable for greater than 60 pack years of tobacco abuse with ongoing smoking one pack per day. He has a positive family history of heart disease with a brother who died of a heart attack at age 64 her mother in her later years of age 82. He has never had a heart attack but has had a "mini stroke. He does have COPD and is followed by Dr. Melvyn Novas. He complains of increasing dyspnea on exertion but denies chest pain. He does have a right bundle branch block. Had colonoscopy which revealed a popliteal unable to remove and therefore he is scheduled for partial colectomy by Dr. Brantley Stage at [facility] surgery.   Current Outpatient Prescriptions  Medication Sig Dispense Refill  . Aclidinium Bromide (TUDORZA PRESSAIR) 400 MCG/ACT AEPB Inhale 1 puff into the lungs 2 (two) times daily. One twice daily      . aspirin 81 MG tablet Take 81 mg by mouth daily.      . Cyanocobalamin (VITAMIN B 12 PO) Take 1 tablet by mouth daily.      . IRON PO Take by mouth. Takes 1 tablet every other day      . levothyroxine (SYNTHROID) 150 MCG tablet TAKE 1 TABLET EVERY DAY  90 tablet  1   No current facility-administered medications for this visit.    Allergies  Allergen Reactions  . Chantix [Varenicline]     REACTION: ---in HIGH dosage (patient can take lower dose) Caused eyes out of focus, h/a,nausea and achiness     History   Social History  . Marital Status: Single    Spouse Name: N/A    Number of Children: N/A  . Years of  Education: N/A   Occupational History  . Retired    Social History Main Topics  . Smoking status: Current Every Day Smoker -- 1.00 packs/day for 64 years    Types: Cigarettes  . Smokeless tobacco: Never Used     Comment: tried chantix but cannot use it/not sure about OTC meds for smoking cessation   . Alcohol Use: No  . Drug Use: No  . Sexual Activity: Not on file   Other Topics Concern  . Not on file   Social History Narrative  . No narrative on file     Review of Systems: General: negative for chills, fever, night sweats or weight changes.  Cardiovascular: negative for chest pain, dyspnea on exertion, edema, orthopnea, palpitations, paroxysmal nocturnal dyspnea or shortness of breath Dermatological: negative for rash Respiratory: negative for cough or wheezing Urologic: negative for hematuria Abdominal: negative for nausea, vomiting, diarrhea, bright red blood per rectum, melena, or hematemesis Neurologic: negative for visual changes, syncope, or dizziness All other systems reviewed and are otherwise negative except as noted above.    Blood pressure 140/90, pulse 85, height 5\' 8"  (1.727 m), weight 140 lb (63.504 kg).  General appearance: alert and no distress Neck: no adenopathy, no carotid bruit, no JVD, supple, symmetrical, trachea midline and thyroid not enlarged, symmetric, no tenderness/mass/nodules Lungs: clear to auscultation  bilaterally Heart: regular rate and rhythm, S1, S2 normal, no murmur, click, rub or gallop Abdomen: soft, non-tender; bowel sounds normal; no masses,  no organomegaly Extremities: extremities normal, atraumatic, no cyanosis or edema and plus pedal pulses bilaterally  EKG normal sinus rhythm at 85 with right bundle branch block  ASSESSMENT AND PLAN:   Dyspnea The patient has COPD. He complains of increasing dyspnea on exertion. He does have cardiac risk factors notable for 60 pack years of tobacco abuse and family history. Based on this and  the fact that he needs to undergo abdominal surgery, I'm going to get a 2-D echocardiogram and a pharmacologic Myoview stress test to risk stratify him. If these are normal I would clear him for his upcoming surgery.      Lorretta Harp MD FACP,FACC,FAHA, [facility] 02/05/2014 11:19 AM

## 2014-02-05 NOTE — Patient Instructions (Signed)
  We will see you back in follow up only as needed.  Dr Gwenlyn Found has ordered : 1.  Echocardiogram. Echocardiography is a painless test that uses sound waves to create images of your heart. It provides your doctor with information about the size and shape of your heart and how well your heart's chambers and valves are working. This procedure takes approximately one hour. There are no restrictions for this procedure.   2. Lexiscan Myoview- this is a test that looks at the blood flow to your heart muscle.  It takes approximately 2 1/2 hours. Please follow instruction sheet, as given.   I will contact you with these results.

## 2014-02-06 ENCOUNTER — Encounter (HOSPITAL_COMMUNITY): Payer: Self-pay | Admitting: *Deleted

## 2014-02-10 ENCOUNTER — Ambulatory Visit (INDEPENDENT_AMBULATORY_CARE_PROVIDER_SITE_OTHER): Payer: Medicare Other | Admitting: Internal Medicine

## 2014-02-10 ENCOUNTER — Encounter: Payer: Self-pay | Admitting: Internal Medicine

## 2014-02-10 ENCOUNTER — Telehealth: Payer: Self-pay

## 2014-02-10 VITALS — BP 124/84 | HR 84 | Temp 97.8°F | Ht 68.0 in | Wt 140.8 lb

## 2014-02-10 DIAGNOSIS — J449 Chronic obstructive pulmonary disease, unspecified: Secondary | ICD-10-CM

## 2014-02-10 DIAGNOSIS — F17200 Nicotine dependence, unspecified, uncomplicated: Secondary | ICD-10-CM

## 2014-02-10 MED ORDER — ACLIDINIUM BROMIDE 400 MCG/ACT IN AEPB: 1.0000 | INHALATION_SPRAY | Freq: Two times a day (BID) | RESPIRATORY_TRACT | Status: DC

## 2014-02-10 NOTE — Assessment & Plan Note (Signed)
-   spirometry 01/27/2014 >  FEV1  0.77 (25%) ratio 42 > trial off spiriva and on tudorza for ? GI side effects  He is doing much better but still smoking (see sep a/p)  Will continue lama and bring back in 6 weeks ? Add LAMA? Next but would still be relatively high risk for laparotomy and in discussion with Drs Carlean Purl and messaging with Dr Brantley Stage the risk may not be worth the benefit.

## 2014-02-10 NOTE — Assessment & Plan Note (Signed)

## 2014-02-10 NOTE — Progress Notes (Signed)
Subjective:    Patient ID: Carl Mcconnell, male    DOB: 1932/11/13   MRN: [id number]  HPI  30 yowm smoker with dx of copd requiring colon surgery and requested pulmonary eval for  01/27/2014  for surgical clearance by Dr Brantley Stage.   01/27/2014 1st [facility] office visit/ Maely Clements / active smoker/ maint on spiriva longterm Chief Complaint  Patient presents with  . Pulmonary Consult    Referred per Dr. Olevia Perches for pulmonary clearance for colon surgery. Pt states dxed with COPD approx 4 yrs ago.  He c/o DOE progressively worse for the past month. He also c/o cough- prod with yellow sputum.  sob indolent onset progressive to point where only x 50 ft - has to sit down on porch and recover after walking to  mail box  abd swelling / freq bms/ can't breath when bends over or belt tight Cough worse in am's All labs and xrays/ekgs at [facility] per pt > requested rec I will obtain [facility] records and discuss with Drs Brantley Stage why you may be loosing wt and what the risks of the surgery may be  In meantime you need to: 1) Stop spiriva and start tudorza one twice daily  2) Stop all smoking - this is the most important aspect of your care    02/10/2014 f/u ov/Marcelina Mclaurin re: COPD GOLD IV on tudorza Chief Complaint  Patient presents with  . Follow-up    Pt reports that his breathing has improved some since last visit. He feels that he does not give out as easy. Appetite also some better.   Not limited by breathing from desired activities   Mailbox and back doe  is better. Cutting down on smoking   No obvious day to day or daytime variabilty or assoc chronic cough or cp or chest tightness, subjective wheeze overt sinus or hb symptoms. No unusual exp hx or h/o childhood pna/ asthma or knowledge of premature birth.  Sleeping ok without nocturnal  or early am exacerbation  of respiratory  c/o's or need for noct saba. Also denies any obvious fluctuation of symptoms with weather or environmental  changes or other aggravating or alleviating factors except as outlined above   Current Medications, Allergies, Complete Past Medical History, Past Surgical History, Family History, and Social History were reviewed in [employer] record.  ROS  The following are not active complaints unless bolded sore throat, dysphagia, dental problems, itching, sneezing,  nasal congestion or excess/ purulent secretions, ear ache,   fever, chills, sweats, unintended wt loss, pleuritic or exertional cp, hemoptysis,  orthopnea pnd or leg swelling, presyncope, palpitations, heartburn, abdominal pain, anorexia, nausea, vomiting, diarrhea  or change in bowel or urinary habits, change in stools or urine, dysuria,hematuria,  rash, arthralgias, visual complaints, headache, numbness weakness or ataxia or problems with walking or coordination,  change in mood/affect or memory.              Objective:   Physical Exam  02/10/2014        141  Wt Readings from Last 3 Encounters:  01/27/14 139 lb 12.8 oz (63.413 kg)  01/05/14 138 lb 12.8 oz (62.959 kg)  12/09/13 154 lb (69.854 kg)      HEENT mild turbinate edema.  Oropharynx no thrush or excess pnd or cobblestoning.  No JVD or cervical adenopathy. Mild accessory muscle hypertrophy. Trachea midline, nl thryroid. Chest was hyperinflated by percussion with diminished breath sounds and moderate increased exp time without  wheeze. Hoover sign positive at mid inspiration. Regular rate and rhythm without murmur gallop or rub or increase P2 or edema.  Abd: no hsm, nl excursion. Ext warm without cyanosis or clubbing.     Labs 01/07/14 nl cbc, tsh, cmet (except alk phos 147  cxr 01/07/14 copd with marked hyperinflation but not acute process     Assessment & Plan:

## 2014-02-10 NOTE — Telephone Encounter (Signed)
Spoke to care giver and June appointment made.

## 2014-02-10 NOTE — Telephone Encounter (Signed)
Message copied by [place], Tanganika Barradas E on Tue Feb 10, 2014  4:03 PM ------      Message from: Gatha Mayer      Created: Tue Feb 10, 2014  3:30 PM      Regarding: needs appt       He needs to see me next available - June is fine      I had referred him for surgery of polyp but has bad lungs so I need to discuss options with him       ------

## 2014-02-10 NOTE — Patient Instructions (Addendum)
Continue tudorza for now but rinse and gargle after use   The key is to stop smoking completely before smoking completely stops you!   Please schedule a follow up office visit in 6 weeks, call sooner if needed with pfts on return

## 2014-02-11 ENCOUNTER — Telehealth: Payer: Self-pay | Admitting: Internal Medicine

## 2014-02-11 MED ORDER — UMECLIDINIUM-VILANTEROL 62.5-25 MCG/INH IN AEPB: 1.0000 | INHALATION_SPRAY | Freq: Every day | RESPIRATORY_TRACT | Status: DC

## 2014-02-11 NOTE — Telephone Encounter (Signed)
Received PA form on Tudorza  Per MW pt can pick up samples of Anoro to take until his next ov  Spoke with his caretaker and advised her of this  She verbalized understanding and states will pick up samples today  I will show her how to use  Samples up front

## 2014-02-13 ENCOUNTER — Encounter (HOSPITAL_COMMUNITY): Payer: Medicare Other

## 2014-02-13 ENCOUNTER — Telehealth (HOSPITAL_COMMUNITY): Payer: Self-pay

## 2014-02-18 ENCOUNTER — Ambulatory Visit (HOSPITAL_BASED_OUTPATIENT_CLINIC_OR_DEPARTMENT_OTHER)
Admission: RE | Admit: 2014-02-18 | Discharge: 2014-02-18 | Disposition: A | Discharge status: Home / Self Care | Payer: Medicare Other | Source: Ambulatory Visit | Attending: Cardiovascular Disease | Admitting: Cardiovascular Disease

## 2014-02-18 ENCOUNTER — Ambulatory Visit (HOSPITAL_COMMUNITY)
Admission: RE | Admit: 2014-02-18 | Discharge: 2014-02-18 | Disposition: A | Discharge status: Home / Self Care | Payer: Medicare Other | Source: Ambulatory Visit | Attending: Cardiovascular Disease | Admitting: Cardiovascular Disease

## 2014-02-18 DIAGNOSIS — R5381 Other malaise: Secondary | ICD-10-CM | POA: Diagnosis not present

## 2014-02-18 DIAGNOSIS — R5383 Other fatigue: Secondary | ICD-10-CM

## 2014-02-18 DIAGNOSIS — I369 Nonrheumatic tricuspid valve disorder, unspecified: Secondary | ICD-10-CM

## 2014-02-18 DIAGNOSIS — I4510 Unspecified right bundle-branch block: Secondary | ICD-10-CM | POA: Diagnosis not present

## 2014-02-18 DIAGNOSIS — I779 Disorder of arteries and arterioles, unspecified: Secondary | ICD-10-CM | POA: Diagnosis not present

## 2014-02-18 DIAGNOSIS — R0989 Other specified symptoms and signs involving the circulatory and respiratory systems: Secondary | ICD-10-CM | POA: Diagnosis not present

## 2014-02-18 DIAGNOSIS — R0602 Shortness of breath: Secondary | ICD-10-CM | POA: Insufficient documentation

## 2014-02-18 DIAGNOSIS — R0609 Other forms of dyspnea: Secondary | ICD-10-CM | POA: Insufficient documentation

## 2014-02-18 DIAGNOSIS — R079 Chest pain, unspecified: Secondary | ICD-10-CM | POA: Insufficient documentation

## 2014-02-18 DIAGNOSIS — Z8673 Personal history of transient ischemic attack (TIA), and cerebral infarction without residual deficits: Secondary | ICD-10-CM | POA: Diagnosis not present

## 2014-02-18 DIAGNOSIS — F17200 Nicotine dependence, unspecified, uncomplicated: Secondary | ICD-10-CM | POA: Insufficient documentation

## 2014-02-18 DIAGNOSIS — J4489 Other specified chronic obstructive pulmonary disease: Secondary | ICD-10-CM | POA: Insufficient documentation

## 2014-02-18 DIAGNOSIS — J449 Chronic obstructive pulmonary disease, unspecified: Secondary | ICD-10-CM | POA: Diagnosis not present

## 2014-02-18 DIAGNOSIS — Z01810 Encounter for preprocedural cardiovascular examination: Secondary | ICD-10-CM

## 2014-02-18 MED ORDER — TECHNETIUM TC 99M SESTAMIBI GENERIC - CARDIOLITE
10.8000 | Freq: Once | INTRAVENOUS | Status: AC | PRN
  Administered 2014-02-18: 11 via INTRAVENOUS

## 2014-02-18 MED ORDER — TECHNETIUM TC 99M SESTAMIBI GENERIC - CARDIOLITE
31.0000 | Freq: Once | INTRAVENOUS | Status: AC | PRN
  Administered 2014-02-18: 31 via INTRAVENOUS

## 2014-02-18 MED ORDER — AMINOPHYLLINE 25 MG/ML IV SOLN
75.0000 mg | Freq: Once | INTRAVENOUS | Status: AC
  Administered 2014-02-18: 75 mg via INTRAVENOUS

## 2014-02-18 MED ORDER — REGADENOSON 0.4 MG/5ML IV SOLN
0.4000 mg | Freq: Once | INTRAVENOUS | Status: AC
  Administered 2014-02-18: 0.4 mg via INTRAVENOUS

## 2014-02-18 NOTE — Progress Notes (Signed)
2D Echo Performed 02/18/2014    [name], RCS  

## 2014-02-18 NOTE — Procedures (Addendum)
[place] [address] [place] [address] [phone]  Cardiology Nuclear Med Study  Carl Mcconnell is a 80 y.o. male     MRN : [id number]     DOB: 1933/08/02  Procedure Date: 02/18/2014  Nuclear Med Background Indication for Stress Test:  Surgical Clearance History:  COPD and No prior cardiac history reported;No prior NUC MPI for comparison;ECHO on 02/05/14 Cardiac Risk Factors: Carotid Disease, CVA, RBBB and Smoker  Symptoms:  Chest Pain, DOE, Fatigue and SOB   Nuclear Pre-Procedure Caffeine/Decaff Intake:  9:00pm NPO After: 7:00am   IV Site: R Hand  IV 0.9% NS with Angio Cath:  22g  Chest Size (in):  38"  IV Started by: Rolene Course, RN  Height: 5\' 8"  (1.727 m)  Cup Size: n/a  BMI:  Body mass index is 21.29 kg/(m^2). Weight:  140 lb (63.504 kg)   Tech Comments:  n/a    Nuclear Med Study 1 or 2 day study: 1 day  Stress Test Type:  [place] Provider:  Quay Burow, MD   Resting Radionuclide: Technetium 32m Sestamibi  Resting Radionuclide Dose: 10.8 mCi   Stress Radionuclide:  Technetium 64m Sestamibi  Stress Radionuclide Dose: 31.0 mCi           Stress Protocol Rest HR: 65 Stress HR: 77  Rest BP: 175/101 Stress BP: 174/106  Exercise Time (min): n/a METS: n/a   Predicted Max HR: 142 bpm % Max HR: 69.72 bpm Rate Pressure Product: 17325  Dose of Adenosine (mg):  n/a Dose of Lexiscan: 0.4 mg  Dose of Atropine (mg): n/a Dose of Dobutamine: n/a mcg/kg/min (at max HR)  Stress Test Technologist: Leane Para, CCT Nuclear Technologist: Imagene Riches, CNMT   Rest Procedure:  Myocardial perfusion imaging was performed at rest 45 minutes following the intravenous administration of Technetium 1m Sestamibi. Stress Procedure:  The patient received IV Lexiscan 0.4 mg over 15-seconds.  Technetium 53m Sestamibi injected IV at 30-seconds.  Patient experienced SOB and flushing and 75 mg of  Aminophylline IV was administered at 5 minutes.   There were no significant changes with Lexiscan.  Quantitative spect images were obtained after a 45 minute delay.  Transient Ischemic Dilatation (Normal <1.22):  0.99 Lung/Heart Ratio (Normal <0.45):  0.48 QGS EDV:  54 ml QGS ESV:  23 ml LV Ejection Fraction: 57%        Rest ECG: NSR-RBBB  Stress ECG: There are scattered PACs.  QPS Raw Data Images:  Normal; no motion artifact; normal heart/lung ratio. Stress Images:  There is decreased uptake in the septum. Rest Images:  Comparison with the stress images reveals no significant change. Subtraction (SDS):  No evidence of ischemia. The fixed septal defect does not respect typical coronary anatomy. LV Wall Motion:  NL LV Function; NL Wall Motion  Impression Exercise Capacity:  Lexiscan with no exercise. BP Response:  Normal blood pressure response. Clinical Symptoms:  No significant symptoms noted. ECG Impression:  No significant ECG changes with Lexiscan. Comparison with Prior Nuclear Study: No previous nuclear study performed   Overall Impression:  Low risk stress nuclear study with a septal fixed defect that is likely artifactual. (although the conduction abnormality is RBBB, rather than LBBB, the perfusion abnormality is very similar to a LBBB related artifact). No ischemia is seen   Sanda Klein, MD  02/18/2014 1:01 PM

## 2014-02-26 ENCOUNTER — Encounter: Payer: Self-pay | Admitting: *Deleted

## 2014-02-27 ENCOUNTER — Telehealth: Payer: Self-pay | Admitting: *Deleted

## 2014-02-27 NOTE — Telephone Encounter (Signed)
Reviewed myoview results with care taker and notified that Dr Gwenlyn Found cleared patient for surgery.  Letter drafted and faxed to Dr Brantley Stage

## 2014-02-27 NOTE — Telephone Encounter (Signed)
Letter drafted and signed by Dr Gwenlyn Found and sent to Dr Brantley Stage

## 2014-02-27 NOTE — Telephone Encounter (Signed)
Message copied by Chauncy Lean on Fri Feb 27, 2014 10:09 AM ------      Message from: Lorretta Harp      Created: Fri Feb 27, 2014 10:05 AM      Regarding: surgical clearance       He is clear to low risk for his upcoming surgery with a normal 2-D echo and low risk Myoview ------

## 2014-03-24 ENCOUNTER — Ambulatory Visit: Payer: Medicare Other | Admitting: Internal Medicine

## 2014-03-26 ENCOUNTER — Ambulatory Visit (INDEPENDENT_AMBULATORY_CARE_PROVIDER_SITE_OTHER): Payer: Medicare Other | Admitting: Internal Medicine

## 2014-03-26 ENCOUNTER — Encounter: Payer: Self-pay | Admitting: Internal Medicine

## 2014-03-26 VITALS — BP 142/94 | HR 84 | Temp 97.7°F | Ht 66.0 in | Wt 138.0 lb

## 2014-03-26 DIAGNOSIS — J449 Chronic obstructive pulmonary disease, unspecified: Secondary | ICD-10-CM

## 2014-03-26 MED ORDER — UMECLIDINIUM-VILANTEROL 62.5-25 MCG/INH IN AEPB: 1.0000 | INHALATION_SPRAY | Freq: Every day | RESPIRATORY_TRACT | Status: DC

## 2014-03-26 NOTE — Progress Notes (Signed)
PFT done today. 

## 2014-03-26 NOTE — Patient Instructions (Addendum)
Continue anoro one puff each am - smooth deep drag x 2 for each Click   I will call with the result of your lung function tests  Please schedule a follow up visit in 3 months but call sooner if needed

## 2014-03-26 NOTE — Progress Notes (Signed)
Subjective:    Patient ID: Carl Mcconnell, male    DOB: April 02, 1933   MRN: [id number]   Brief patient profile:  39 yowm smoker with dx of copd requiring colon surgery and requested pulmonary eval for  01/27/2014  for surgical clearance by Dr Brantley Stage.   History of Present Illness  01/27/2014 1st [facility] office visit/ Melvyn Novas / active smoker/ maint on spiriva longterm Chief Complaint  Patient presents with  . Pulmonary Consult    Referred per Dr. Olevia Perches for pulmonary clearance for colon surgery. Pt states dxed with COPD approx 4 yrs ago.  He c/o DOE progressively worse for the past month. He also c/o cough- prod with yellow sputum.  sob indolent onset progressive to point where only x 50 ft - has to sit down on porch and recover after walking to  mail box  abd swelling / freq bms/ can't breath when bends over or belt tight Cough worse in am's All labs and xrays/ekgs at [facility] nl per pt > requested rec I will obtain [facility] records and discuss with Drs Brantley Stage why you may be loosing wt and what the risks of the surgery may be  In meantime you need to: 1) Stop spiriva and start tudorza one twice daily  2) Stop all smoking - this is the most important aspect of your care    02/10/2014 f/u ov/Wert re: COPD GOLD IV on tudorza Chief Complaint  Patient presents with  . Follow-up    Pt reports that his breathing has improved some since last visit. He feels that he does not give out as easy. Appetite also some better.   Not limited by breathing from desired activities   Mailbox and back doe  is better. Cutting down on smoking  rec Continue tudorza for now but rinse and gargle after use  The key is to stop smoking completely before smoking completely stops you!    03/26/2014 f/u ov/Wert re: gold iv copd  Chief Complaint  Patient presents with  . Follow-up    Pt states that his breathing is about the same- some worse with humidity  feels anoro helps the most with doe  mailbox and back   No obvious day to day or daytime variabilty or assoc chronic cough or cp or chest tightness, subjective wheeze overt sinus or hb symptoms. No unusual exp hx or h/o childhood pna/ asthma or knowledge of premature birth.  Sleeping ok without nocturnal  or early am exacerbation  of respiratory  c/o's or need for noct saba. Also denies any obvious fluctuation of symptoms with weather or environmental changes or other aggravating or alleviating factors except as outlined above   Current Medications, Allergies, Complete Past Medical History, Past Surgical History, Family History, and Social History were reviewed in [employer] record.  ROS  The following are not active complaints unless bolded sore throat, dysphagia, dental problems, itching, sneezing,  nasal congestion or excess/ purulent secretions, ear ache,   fever, chills, sweats, unintended wt loss, pleuritic or exertional cp, hemoptysis,  orthopnea pnd or leg swelling, presyncope, palpitations, heartburn, abdominal pain, anorexia, nausea, vomiting, diarrhea  or change in bowel or urinary habits, change in stools or urine, dysuria,hematuria,  rash, arthralgias, visual complaints, headache, numbness weakness or ataxia or problems with walking or coordination,  change in mood/affect or memory.              Objective:   Physical Exam  02/10/2014  141 > 03/26/2014  138  Wt Readings from Last 3 Encounters:  01/27/14 139 lb 12.8 oz (63.413 kg)  01/05/14 138 lb 12.8 oz (62.959 kg)  12/09/13 154 lb (69.854 kg)      HEENT mild turbinate edema.  Oropharynx no thrush or excess pnd or cobblestoning.  No JVD or cervical adenopathy. Mild accessory muscle hypertrophy. Trachea midline, nl thryroid. Chest was hyperinflated by percussion with diminished breath sounds and moderate increased exp time without wheeze. Hoover sign positive at mid inspiration. Regular rate and rhythm without murmur gallop or rub or  increase P2 or edema.  Abd: no hsm, nl excursion. Ext warm without cyanosis or clubbing.     Labs 01/07/14 nl cbc, tsh, cmet (except alk phos 147) cxr 01/07/14 copd with marked hyperinflation but not acute process     Assessment & Plan:

## 2014-04-08 ENCOUNTER — Encounter: Payer: Self-pay | Admitting: Internal Medicine

## 2014-04-08 ENCOUNTER — Ambulatory Visit (INDEPENDENT_AMBULATORY_CARE_PROVIDER_SITE_OTHER): Payer: Medicare Other | Admitting: Internal Medicine

## 2014-04-08 VITALS — BP 132/86 | HR 88 | Ht 66.0 in | Wt 137.1 lb

## 2014-04-08 DIAGNOSIS — F17200 Nicotine dependence, unspecified, uncomplicated: Secondary | ICD-10-CM

## 2014-04-08 DIAGNOSIS — D126 Benign neoplasm of colon, unspecified: Secondary | ICD-10-CM

## 2014-04-08 DIAGNOSIS — D120 Benign neoplasm of cecum: Secondary | ICD-10-CM

## 2014-04-08 DIAGNOSIS — R634 Abnormal weight loss: Secondary | ICD-10-CM | POA: Insufficient documentation

## 2014-04-08 NOTE — Patient Instructions (Addendum)
You have been given a separate informational sheet regarding your tobacco use, the importance of quitting and local resources to help you quit.  You have been scheduled for a colonoscopy. Please follow written instructions given to you at your visit today.  Please pick up your supplies at the pharmacy. If you use inhalers (even only as needed), please bring them with you on the day of your procedure.   I appreciate the opportunity to care for you.

## 2014-04-08 NOTE — Assessment & Plan Note (Signed)
Wt Readings from Last 3 Encounters:  04/08/14 137 lb 2 oz (62.199 kg)  03/26/14 138 lb (62.596 kg)  02/18/14 140 lb (63.504 kg)   Weight was 69 kg in winter but was 62-64 kg 1 year ago  So probably just fluctuation  May be a candidate for nutritional supplement program for COPD

## 2014-04-08 NOTE — Progress Notes (Signed)
         Subjective:    Patient ID: Carl Mcconnell, male    DOB: [birth year], 80 y.o.   MRN: [id number]  HPI Elderly wm here with wife to discuss treatment options of ceceal polyp - had referred to surgery but he has COPD Glold Stage IV so poor operative candidate. Has seen Dr. Melvyn Novas and is improved with Tx changes. Recent PFT's He and wife concerned about weight loss. Appetite ok Says if he is engaged he is ok (working in shed, etc) but if sits in chair sleeps. Denies overt depression.  PCP is now [facility]   Medications, allergies, past medical history, past surgical history, family history and social history are reviewed and updated in the EMR.   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 Try Nicotine patch We gave cessation program #  Loss of weight Wt Readings from Last 3 Encounters:  04/08/14 137 lb 2 oz (62.199 kg)  03/26/14 138 lb (62.596 kg)  02/18/14 140 lb (63.504 kg)   Weight was 69 kg in winter but was 62-64 kg 1 year ago  So probably just fluctuation  May be a candidate for nutritional supplement program for COPD    I appreciate the opportunity to care for thispatient. Gatha Mayer, MD, [facility]

## 2014-04-08 NOTE — Assessment & Plan Note (Signed)
Counseled to quit Encouraged cutting back at least'? Try Nicotine patch We gave cessation program #

## 2014-04-08 NOTE — Assessment & Plan Note (Addendum)
Will attempt removal/ablation with colonoscopy at [facility] 06/2014 since operative risk/benefit ratio not favorable given COPD He is aware of risks/benefits

## 2014-04-14 ENCOUNTER — Encounter: Payer: Self-pay | Admitting: Internal Medicine

## 2014-04-16 NOTE — Telephone Encounter (Signed)
Encounter complete. 

## 2014-04-19 LAB — PULMONARY FUNCTION TEST
DL/VA % pred: 53 %
DL/VA: 2.32 ml/min/mmHg/L
DLCO unc % pred: 43 %
DLCO unc: 11.66 ml/min/mmHg
FEF 25-75 Post: 0.51 L/sec
FEF 25-75 Pre: 0.52 L/sec
FEF2575-%CHANGE-POST: -2 %
FEF2575-%Pred-Post: 30 %
FEF2575-%Pred-Pre: 31 %
FEV1-%Change-Post: -2 %
FEV1-%PRED-PRE: 53 %
FEV1-%Pred-Post: 52 %
FEV1-POST: 1.27 L
FEV1-Pre: 1.3 L
FEV1FVC-%CHANGE-POST: -4 %
FEV1FVC-%Pred-Pre: 67 %
FEV6-%Change-Post: 0 %
FEV6-%PRED-POST: 80 %
FEV6-%Pred-Pre: 79 %
FEV6-PRE: 2.54 L
FEV6-Post: 2.55 L
FEV6FVC-%Change-Post: -1 %
FEV6FVC-%Pred-Post: 101 %
FEV6FVC-%Pred-Pre: 102 %
FVC-%Change-Post: 2 %
FVC-%Pred-Post: 79 %
FVC-%Pred-Pre: 77 %
FVC-Post: 2.71 L
FVC-Pre: 2.65 L
POST FEV1/FVC RATIO: 47 %
POST FEV6/FVC RATIO: 94 %
Pre FEV1/FVC ratio: 49 %
Pre FEV6/FVC Ratio: 96 %
RV % pred: 195 %
RV: 4.71 L
TLC % PRED: 120 %
TLC: 7.53 L

## 2014-06-09 ENCOUNTER — Encounter (HOSPITAL_COMMUNITY): Payer: Self-pay | Admitting: Pharmacy Technician

## 2014-06-17 ENCOUNTER — Encounter (HOSPITAL_COMMUNITY): Payer: Self-pay | Admitting: *Deleted

## 2014-06-23 ENCOUNTER — Encounter: Payer: Self-pay | Admitting: Cardiovascular Disease

## 2014-06-23 ENCOUNTER — Ambulatory Visit (INDEPENDENT_AMBULATORY_CARE_PROVIDER_SITE_OTHER): Payer: Medicare Other | Admitting: Cardiovascular Disease

## 2014-06-23 VITALS — BP 110/80 | HR 80 | Ht 67.0 in | Wt 147.0 lb

## 2014-06-23 DIAGNOSIS — R0602 Shortness of breath: Secondary | ICD-10-CM

## 2014-06-23 DIAGNOSIS — R609 Edema, unspecified: Secondary | ICD-10-CM

## 2014-06-23 DIAGNOSIS — R600 Localized edema: Secondary | ICD-10-CM

## 2014-06-23 DIAGNOSIS — Z79899 Other long term (current) drug therapy: Secondary | ICD-10-CM

## 2014-06-23 MED ORDER — POTASSIUM CHLORIDE CRYS ER 20 MEQ PO TBCR: 20.0000 meq | EXTENDED_RELEASE_TABLET | Freq: Every day | ORAL | Status: DC

## 2014-06-23 MED ORDER — FUROSEMIDE 40 MG PO TABS: 40.0000 mg | ORAL_TABLET | Freq: Every morning | ORAL | Status: DC

## 2014-06-23 NOTE — Progress Notes (Signed)
06/23/2014 Carl Mcconnell   1933/06/15  [id number]  Primary Physician Carl Pillow, NP Primary Cardiologist: Carl Harp MD Carl Mcconnell    HPI:  Mr. Carl Mcconnell is a 80 -year-old Caucasian male who presents today with his significant other of 30 years. He has 2 children and 3 grandchildren. He was referred for preoperative clearance before yet to be scheduled abdominal surgery for partial colectomy because of a polyp. Past cardiovascular history profile is remarkable for greater than 60 pack years of tobacco abuse with ongoing smoking one pack per day. He has a positive family history of heart disease with a brother who died of a heart attack at age 52 her mother in her later years of age 34. He has never had a heart attack but has had a "mini stroke. He does have COPD and is followed by Carl Mcconnell. He complains of increasing dyspnea on exertion but denies chest pain. He does have a right bundle branch block. Had colonoscopy which revealed a polyp , it was thought that this was only removable by partial colectomy however because of his severe COPD Carl Mcconnell agreed to attempt colonoscopic removal which is scheduled for later this week. Since I saw him back in 5 months ago he developed lower extremity pitting edema of unclear etiology. He was placed on a low-dose diuretic by his primary care physician with some improvement.   Current Outpatient Prescriptions  Medication Sig Dispense Refill  . aspirin 81 MG tablet Take 81 mg by mouth daily.      . ferrous sulfate 325 (65 FE) MG tablet Take 325 mg by mouth every other day.      . furosemide (LASIX) 20 MG tablet Take 20 mg by mouth every morning. 06-17-14 recently started this      . levothyroxine (SYNTHROID, LEVOTHROID) 175 MCG tablet Take 175 mcg by mouth daily before breakfast.      . Umeclidinium-Vilanterol (ANORO ELLIPTA) 62.5-25 MCG/INH AEPB Inhale 1 puff into the lungs daily.  28 each  11  . vitamin B-12 (CYANOCOBALAMIN)  1000 MCG tablet Take 1,000 mcg by mouth daily.       No current facility-administered medications for this visit.    Allergies  Allergen Reactions  . Chantix [Varenicline]     REACTION: ---in HIGH dosage (patient can take lower dose) Caused eyes out of focus, h/a,nausea and achiness     History   Social History  . Marital Status: Single    Spouse Name: N/A    Number of Children: N/A  . Years of Education: N/A   Occupational History  . Retired    Social History Main Topics  . Smoking status: Current Every Day Smoker -- 1.00 packs/day for 64 years    Types: Cigarettes  . Smokeless tobacco: Never Used     Comment: tried chantix but cannot use it/not sure about OTC meds for smoking cessation   . Alcohol Use: No  . Drug Use: No  . Sexual Activity: Not on file   Other Topics Concern  . Not on file   Social History Narrative  . No narrative on file     Review of Systems: General: negative for chills, fever, night sweats or weight changes.  Cardiovascular: negative for chest pain, dyspnea on exertion, edema, orthopnea, palpitations, paroxysmal nocturnal dyspnea or shortness of breath Dermatological: negative for rash Respiratory: negative for cough or wheezing Urologic: negative for hematuria Abdominal: negative for nausea, vomiting, diarrhea, bright red blood per  rectum, melena, or hematemesis Neurologic: negative for visual changes, syncope, or dizziness All other systems reviewed and are otherwise negative except as noted above.    Blood pressure 110/80, pulse 80, height 5\' 7"  (1.702 m), weight 147 lb (66.679 kg).  General appearance: alert and no distress Neck: no adenopathy, no carotid bruit, no JVD, supple, symmetrical, trachea midline and thyroid not enlarged, symmetric, no tenderness/mass/nodules Lungs: clear to auscultation bilaterally Heart: regular rate and rhythm, S1, S2 normal, no murmur, click, rub or gallop Extremities: edema 2+ pitting edema  EKG not  performed today  ASSESSMENT AND PLAN:   Dyspnea Probably related to his COPD. He had a normal 2-D echo and a negative Myoview stress test.  Lower extremity edema Since I saw him 5 months ago she developed pitting bilateral lower shin edema. He was placed on low-dose diuretic by his primary care physician with mild improvement. He is aware is soft attention is a normal 2-D echo. His wife says that he had more lethargy and shortness of breath recently. I'm going to recheck a 2-D echo, Doppler his diuretic and had potassium repletion as well as prescribe compression stockings. We'll get blood work in 2 weeks and have him see mid-level provider back in one month.      Carl Harp MD FACP,FACC,FAHA, [facility] 06/23/2014 10:30 AM

## 2014-06-23 NOTE — Assessment & Plan Note (Signed)
Probably related to his COPD. He had a normal 2-D echo and a negative Myoview stress test.

## 2014-06-23 NOTE — Patient Instructions (Signed)
We will see you back in follow up in 1 month with an extender and 6 months with Dr Gwenlyn Found.   Dr Gwenlyn Found has ordered: 1. Medication changes-Start: Kdur 25meq daily                                        Increase: Furosemide to 40mg  daily  2.  Echocardiogram. Echocardiography is a painless test that uses sound waves to create images of your heart. It provides your doctor with information about the size and shape of your heart and how well your heart's chambers and valves are working. This procedure takes approximately one hour. There are no restrictions for this procedure.  3. Your physician recommends that you return for lab work in: 3 weeks ( October 1st)  4. Compression stockings are elastic stockings that "compress" your legs. This helps to increase blood flow, decrease swelling, and reduces the chance of getting blood clots in your lower legs. Compression stockings are used:  After surgery.  If you have a history of poor circulation.  If you are prone to blood clots.  If you have varicose veins.  If you sit or are bedridden for long periods of time. WEARING COMPRESSION STOCKINGS  Your compression stockings should be worn as instructed by your caregiver.  Wearing the correct stocking size is important. Your caregiver can help measure and fit you to the correct size.  When wearing your stockings, do not allow the stockings to bunch up. This is especially important around your toes or behind your knees. Keep the stockings as smooth as possible.  Do not roll the stockings downward and leave them rolled down. This can form a restrictive band around your legs and can decrease blood flow.  The stockings should be removed once a day for 1 hour or as instructed by your caregiver. When the stockings are taken off, inspect your legs and feet. Look for:  Open sores.  Red spots.  Puffy areas (swelling).  Anything that does not seem normal. IMPORTANT INFORMATION ABOUT COMPRESSION  STOCKINGS  The compression stockings should be clean, dry, and in good condition before you put them on.  Do not put lotion on your legs or feet. This makes it harder to put the stockings on.  Change your stockings immediately if they become wet or soiled.  Do not wear stockings that are ripped or torn.  You may hand-wash or put your stockings in the washing machine. Use cold or warm water with mild detergent. Do not bleach your stockings. They may be air-dried or dried in the dryer on low heat.  If you have pain or have a feeling of "pins and needles" in your feet or legs, you may be wearing stockings that are too tight. Call your caregiver right away. SEEK IMMEDIATE MEDICAL CARE IF:   You have numbness or tingling in your lower legs that does not get better quickly after the stockings are removed.  Your toes or feet become cold and blue.  You develop open sores or have red spots on your legs that do not go away. MAKE SURE YOU:   Understand these instructions.  Will watch your condition.  Will get help right away if you are not doing well or get worse. Document Released: 07/30/2009 Document Revised: 12/25/2011 Document Reviewed: 07/30/2009 [facility] Patient Information 2015 [place], [place]. This information is not intended to replace advice given to  you by your health care provider. Make sure you discuss any questions you have with your health care provider.  1st )

## 2014-06-23 NOTE — Assessment & Plan Note (Signed)
Since I saw him 5 months ago she developed pitting bilateral lower shin edema. He was placed on low-dose diuretic by his primary care physician with mild improvement. He is aware is soft attention is a normal 2-D echo. His wife says that he had more lethargy and shortness of breath recently. I'm going to recheck a 2-D echo, Doppler his diuretic and had potassium repletion as well as prescribe compression stockings. We'll get blood work in 2 weeks and have him see mid-level provider back in one month.

## 2014-06-30 ENCOUNTER — Ambulatory Visit (HOSPITAL_COMMUNITY)
Admission: RE | Admit: 2014-06-30 | Discharge: 2014-06-30 | Disposition: A | Discharge status: Home / Self Care | Payer: Medicare Other | Source: Ambulatory Visit | Attending: Internal Medicine | Admitting: Internal Medicine

## 2014-06-30 ENCOUNTER — Encounter (HOSPITAL_COMMUNITY): Payer: Self-pay | Admitting: *Deleted

## 2014-06-30 ENCOUNTER — Encounter (HOSPITAL_COMMUNITY)
Admission: RE | Disposition: A | Discharge status: Home / Self Care | Payer: Self-pay | Source: Ambulatory Visit | Attending: Internal Medicine

## 2014-06-30 ENCOUNTER — Encounter (HOSPITAL_COMMUNITY): Payer: Medicare Other | Admitting: Anesthesiology

## 2014-06-30 ENCOUNTER — Ambulatory Visit (HOSPITAL_COMMUNITY): Payer: Medicare Other | Admitting: Anesthesiology

## 2014-06-30 DIAGNOSIS — J449 Chronic obstructive pulmonary disease, unspecified: Secondary | ICD-10-CM | POA: Insufficient documentation

## 2014-06-30 DIAGNOSIS — D649 Anemia, unspecified: Secondary | ICD-10-CM | POA: Diagnosis not present

## 2014-06-30 DIAGNOSIS — R609 Edema, unspecified: Secondary | ICD-10-CM | POA: Diagnosis not present

## 2014-06-30 DIAGNOSIS — M51379 Other intervertebral disc degeneration, lumbosacral region without mention of lumbar back pain or lower extremity pain: Secondary | ICD-10-CM | POA: Insufficient documentation

## 2014-06-30 DIAGNOSIS — M5137 Other intervertebral disc degeneration, lumbosacral region: Secondary | ICD-10-CM | POA: Diagnosis not present

## 2014-06-30 DIAGNOSIS — Z8673 Personal history of transient ischemic attack (TIA), and cerebral infarction without residual deficits: Secondary | ICD-10-CM | POA: Diagnosis not present

## 2014-06-30 DIAGNOSIS — D126 Benign neoplasm of colon, unspecified: Secondary | ICD-10-CM | POA: Insufficient documentation

## 2014-06-30 DIAGNOSIS — R634 Abnormal weight loss: Secondary | ICD-10-CM

## 2014-06-30 DIAGNOSIS — E039 Hypothyroidism, unspecified: Secondary | ICD-10-CM | POA: Insufficient documentation

## 2014-06-30 DIAGNOSIS — I4510 Unspecified right bundle-branch block: Secondary | ICD-10-CM | POA: Diagnosis not present

## 2014-06-30 DIAGNOSIS — Z8249 Family history of ischemic heart disease and other diseases of the circulatory system: Secondary | ICD-10-CM | POA: Diagnosis not present

## 2014-06-30 DIAGNOSIS — D120 Benign neoplasm of cecum: Secondary | ICD-10-CM

## 2014-06-30 DIAGNOSIS — J4489 Other specified chronic obstructive pulmonary disease: Secondary | ICD-10-CM | POA: Insufficient documentation

## 2014-06-30 DIAGNOSIS — K529 Noninfective gastroenteritis and colitis, unspecified: Secondary | ICD-10-CM | POA: Diagnosis present

## 2014-06-30 DIAGNOSIS — D129 Benign neoplasm of anus and anal canal: Secondary | ICD-10-CM

## 2014-06-30 DIAGNOSIS — F17200 Nicotine dependence, unspecified, uncomplicated: Secondary | ICD-10-CM | POA: Diagnosis not present

## 2014-06-30 DIAGNOSIS — N529 Male erectile dysfunction, unspecified: Secondary | ICD-10-CM | POA: Insufficient documentation

## 2014-06-30 DIAGNOSIS — K5730 Diverticulosis of large intestine without perforation or abscess without bleeding: Secondary | ICD-10-CM | POA: Diagnosis not present

## 2014-06-30 DIAGNOSIS — D128 Benign neoplasm of rectum: Secondary | ICD-10-CM | POA: Diagnosis present

## 2014-06-30 HISTORY — DX: Localized edema: R60.0

## 2014-06-30 HISTORY — PX: HOT HEMOSTASIS: SHX5433

## 2014-06-30 HISTORY — PX: COLONOSCOPY: SHX5424

## 2014-06-30 SURGERY — COLONOSCOPY
Anesthesia: Monitor Anesthesia Care

## 2014-06-30 MED ORDER — PROPOFOL 10 MG/ML IV BOLUS
INTRAVENOUS | Status: AC
  Filled 2014-06-30: qty 20

## 2014-06-30 MED ORDER — SODIUM CHLORIDE 0.9 % IV SOLN: INTRAVENOUS | Status: DC

## 2014-06-30 MED ORDER — LIDOCAINE HCL (CARDIAC) 20 MG/ML IV SOLN
INTRAVENOUS | Status: AC
  Filled 2014-06-30: qty 5

## 2014-06-30 MED ORDER — PROPOFOL INFUSION 10 MG/ML OPTIME
INTRAVENOUS | Status: DC | PRN
  Administered 2014-06-30: 140 ug/kg/min via INTRAVENOUS

## 2014-06-30 MED ORDER — LACTATED RINGERS IV SOLN
INTRAVENOUS | Status: DC | PRN
  Administered 2014-06-30: 10:00:00 via INTRAVENOUS

## 2014-06-30 MED ORDER — COLESTIPOL HCL 5 G PO PACK: 5.0000 g | PACK | Freq: Every day | ORAL | Status: AC

## 2014-06-30 MED ORDER — SODIUM CHLORIDE 0.9 % IJ SOLN
INTRAMUSCULAR | Status: AC
  Filled 2014-06-30: qty 20

## 2014-06-30 MED ORDER — LACTATED RINGERS IV SOLN: INTRAVENOUS | Status: DC

## 2014-06-30 MED ORDER — ASPIRIN 81 MG PO TABS: 81.0000 mg | ORAL_TABLET | Freq: Every day | ORAL | Status: AC

## 2014-06-30 NOTE — Anesthesia Preprocedure Evaluation (Addendum)
Anesthesia Evaluation  Patient identified by MRN, date of birth, ID band Patient awake    Reviewed: Allergy & Precautions, H&P , NPO status , Patient's Chart, lab work & pertinent test results  Airway Mallampati: II TM Distance: >3 FB     Dental  (+) Poor Dentition   Pulmonary shortness of breath and with exertion, COPDCurrent Smoker,  breath sounds clear to auscultation        Cardiovascular + dysrhythmias Rhythm:Regular Rate:Normal     Neuro/Psych CVA    GI/Hepatic Neg liver ROS,   Endo/Other  Hypothyroidism   Renal/GU negative Renal ROS     Musculoskeletal  (+) Arthritis -,   Abdominal   Peds  Hematology  (+) anemia ,   Anesthesia Other Findings   Reproductive/Obstetrics                         Anesthesia Physical  Anesthesia Plan  ASA: III  Anesthesia Plan: MAC   Post-op Pain Management:    Induction: Intravenous  Airway Management Planned:   Additional Equipment:   Intra-op Plan:   Post-operative Plan:   Informed Consent: I have reviewed the patients History and Physical, chart, labs and discussed the procedure including the risks, benefits and alternatives for the proposed anesthesia with the patient or authorized representative who has indicated his/her understanding and acceptance.   Dental advisory given  Plan Discussed with: CRNA  Anesthesia Plan Comments:         Anesthesia Quick Evaluation

## 2014-06-30 NOTE — Discharge Instructions (Signed)
I removed or destroyed the remaining polyp in the cecum (large polyp). I also saw two tiny rectal polyps and removed them.  Do not use aspirin, ibuprofen, naprosyn (Aleve), Goody's etc until Sept 29. Tylenol (acetaminophen is ok)  Will call with results and plans.  I appreciate the opportunity to care for you. Gatha Mayer, MD, FACG   YOU HAD AN ENDOSCOPIC PROCEDURE TODAY: Refer to the procedure report and other information in the discharge instructions given to you for any specific questions about what was found during the examination. If this information does not answer your questions, please call Dr. Celesta Aver office at [phone] to clarify.   YOU SHOULD EXPECT: Some feelings of bloating in the abdomen. Passage of more gas than usual. Walking can help get rid of the air that was put into your GI tract during the procedure and reduce the bloating. If you had a lower endoscopy (such as a colonoscopy or flexible sigmoidoscopy) you may notice spotting of blood in your stool or on the toilet paper. Some abdominal soreness may be present for a day or two, also.  DIET: Your first meal following the procedure should be a light meal and then it is ok to progress to your normal diet. A half-sandwich or bowl of soup is an example of a good first meal. Heavy or fried foods are harder to digest and may make you feel nauseous or bloated. Drink plenty of fluids but you should avoid alcoholic beverages for 24 hours.   ACTIVITY: Your care partner should take you home directly after the procedure. You should plan to take it easy, moving slowly for the rest of the day. You can resume normal activity the day after the procedure however YOU SHOULD NOT DRIVE, use power tools, machinery or perform tasks that involve climbing or major physical exertion for 24 hours (because of the sedation medicines used during the test).   SYMPTOMS TO REPORT IMMEDIATELY: A gastroenterologist can be reached at any hour. Please  call [phone]  for any of the following symptoms:  Following lower endoscopy (colonoscopy, flexible sigmoidoscopy) Excessive amounts of blood in the stool  Significant tenderness, worsening of abdominal pains  Swelling of the abdomen that is new, acute  Fever of 100 or higher   FOLLOW UP:  If any biopsies were taken you will be contacted by phone or by letter within the next 1-3 weeks. Call [phone]  if you have not heard about the biopsies in 3 weeks.  Please also call with any specific questions about appointments or follow up tests.

## 2014-06-30 NOTE — Transfer of Care (Signed)
Immediate Anesthesia Transfer of Care Note  Patient: Carl Mcconnell  Procedure(s) Performed: Procedure(s): COLONOSCOPY (N/A) HOT HEMOSTASIS (ARGON PLASMA COAGULATION/BICAP) (N/A)  Patient Location: PACU  Anesthesia Type:MAC  Level of Consciousness: awake, sedated and patient cooperative  Airway & Oxygen Therapy: Patient Spontanous Breathing and Patient connected to face mask oxygen  Post-op Assessment: Report given to PACU RN and Post -op Vital signs reviewed and stable  Post vital signs: Reviewed and stable  Complications: No apparent anesthesia complications

## 2014-06-30 NOTE — Anesthesia Postprocedure Evaluation (Signed)
Anesthesia Post Note  Patient: Carl Mcconnell  Procedure(s) Performed: Procedure(s) (LRB): COLONOSCOPY (N/A) HOT HEMOSTASIS (ARGON PLASMA COAGULATION/BICAP) (N/A)  Anesthesia type: MAC  Patient location: PACU  Post pain: Pain level controlled  Post assessment: Post-op Vital signs reviewed  Last Vitals: BP 100/74  Pulse 97  Temp(Src) 36.4 C (Oral)  Resp 24  Ht 5\' 7"  (1.702 m)  Wt 147 lb (66.679 kg)  BMI 23.02 kg/m2  SpO2 100%  Post vital signs: Reviewed  Level of consciousness: awake  Complications: No apparent anesthesia complications

## 2014-06-30 NOTE — H&P (Signed)
[facility] History and Physical   Primary Care Physician:  Imelda Pillow, NP   Reason for Procedure:   Cecal polyp - try to finish removal  Plan:    Colonoscopy, polypectomy, ablation     The risks and benefits as well as alternatives of endoscopic procedure(s) have been discussed and reviewed. All questions answered. The patient agrees to proceed.    HPI: Carl Mcconnell is a 80 y.o. male  The patient is here for reassessment of a cecal polyp - not a surgical candidate due to COPD. Will try to remove and ablate today. He also has frequent post-prandial diarrhea with urgency since cholecystectomy.   Past Medical History  Diagnosis Date  . Hypothyroidism   . Colon polyp   . Anemia   . ED (erectile dysfunction)   . COPD (chronic obstructive pulmonary disease)   . Shortness of breath   . Stroke 1998    vision trouble  . Degenerative disc disease     LS  . Cholecystitis 10/04/2012  . 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 Right bundle branch block   . Dyspnea on exertion   . Family history of heart disease   . Edema extremities     bilateral lower extremities-"was recently placed on fluid pill"    Past Surgical History  Procedure Laterality Date  . Hernia repair Left 1998  . Eye surgery  2000    left eye; muscle surgery  . Rectal abscess  10/2002    foreign body  . Cholecystectomy  10/29/2012    Procedure: LAPAROSCOPIC CHOLECYSTECTOMY WITH INTRAOPERATIVE CHOLANGIOGRAM;  Surgeon: Joyice Faster. Cornett, MD;  Location: [facility];  Service: General;  Laterality: N/A;  laparoscopic cholecystectomy with intraoperative cholangiogram  . Colonoscopy  multiple  . Esophagogastroduodenoscopy    . Ct perc cholecystostomy  2013  . Cataract extraction, bilateral Bilateral     Prior to Admission medications   Medication Sig Start Date End Date Taking? Authorizing Provider  aspirin 81 MG tablet Take 81 mg by mouth daily.   Yes Historical Provider, MD   ferrous sulfate 325 (65 FE) MG tablet Take 325 mg by mouth every other day.   Yes Historical Provider, MD  furosemide (LASIX) 40 MG tablet Take 1 tablet (40 mg total) by mouth every morning. 06-17-14 recently started this 06/23/14  Yes Lorretta Harp, MD  levothyroxine (SYNTHROID, LEVOTHROID) 175 MCG tablet Take 175 mcg by mouth daily before breakfast.   Yes Historical Provider, MD  potassium chloride SA (K-DUR,KLOR-CON) 20 MEQ tablet Take 1 tablet (20 mEq total) by mouth daily. 06/23/14  Yes Lorretta Harp, MD  Umeclidinium-Vilanterol (ANORO ELLIPTA) 62.5-25 MCG/INH AEPB Inhale 1 puff into the lungs daily. 03/26/14  Yes Tanda Rockers, MD  vitamin B-12 (CYANOCOBALAMIN) 1000 MCG tablet Take 1,000 mcg by mouth daily.   Yes Historical Provider, MD    Current Facility-Administered Medications  Medication Dose Route Frequency Provider Last Rate Last Dose  . 0.9 %  sodium chloride infusion   Intravenous Continuous Gatha Mayer, MD      . lactated ringers infusion   Intravenous Continuous Gatha Mayer, MD        Allergies as of 04/08/2014 - Review Complete 04/08/2014  Allergen Reaction Noted  . Chantix [varenicline]  10/15/2012    Family History  Problem Relation Age of Onset  . Heart disease Mother     CAD  . Stroke Father   . Diabetes Sister   .  Hypertension Sister   . Heart disease Brother     MI  . Colon cancer Neg Hx     History   Social History  . Marital Status: Single    Spouse Name: N/A    Number of Children: N/A  . Years of Education: N/A   Occupational History  . Retired    Social History Main Topics  . Smoking status: Current Every Day Smoker -- 1.00 packs/day for 64 years    Types: Cigarettes  . Smokeless tobacco: Never Used     Comment: tried chantix but cannot use it/not sure about OTC meds for smoking cessation   . Alcohol Use: No  . Drug Use: No  . Sexual Activity: Not on file   Other Topics Concern  . Not on file   Social History Narrative  . No  narrative on file    Review of Systems: Positive for chronic dyspnea, weight loss All other review of systems negative except as mentioned in the HPI.  Physical Exam: Vital signs in last 24 hours: Temp:  [97.8 F (36.6 C)] 97.8 F (36.6 C) (09/15 1007) Pulse Rate:  [93] 93 (09/15 1007) Resp:  [98] 98 (09/15 1007) BP: (139)/(103) 139/103 mmHg (09/15 1007) SpO2:  [98 %] 98 % (09/15 1007) Weight:  [147 lb (66.679 kg)] 147 lb (66.679 kg) (09/15 1007)   General:   Alert,  Well-developed, well-nourished, pleasant and cooperative in NAD Lungs:  Clear throughout to auscultation.   Heart:  Regular rate and rhythm; no murmurs, clicks, rubs,  or gallops. Abdomen:  Soft, nontender and nondistended. Normal bowel sounds.   Neuro/Psych:  Alert and cooperative. Normal mood and affect. A and O x 3   @Carl  Simonne Maffucci, MD, [facility] [phone] (pager) 06/30/2014 10:21 AM@

## 2014-06-30 NOTE — Op Note (Addendum)
[facility] [place], [address]   COLONOSCOPY PROCEDURE REPORT  PATIENT: Carl Mcconnell, Carl Mcconnell  MR#: [id number] BIRTHDATE: 1933/04/06 , 81  yrs. old GENDER: Male ENDOSCOPIST: Gatha Mayer, MD, [facility] PROCEDURE DATE:  06/30/2014 PROCEDURE:   Colonoscopy with snare polypectomy, Submucosal injection, any substance, and Colonoscopy with tissue ablation First Screening Colonoscopy - Avg.  risk and is 50 yrs.  old or older - No.  Prior Negative Screening - Now for repeat screening. N/A  History of Adenoma - Now for follow-up colonoscopy & has been > or = to 3 yrs.  N/A  Polyps Removed Today? Yes. ASA CLASS:   Class III INDICATIONS:Polypectomy completion/ablation. MEDICATIONS: See Anesthesia Report.  DESCRIPTION OF PROCEDURE:   After the risks benefits and alternatives of the procedure were thoroughly explained, informed consent was obtained.  A digital rectal exam revealed no abnormalities of the rectum.   The [employer] Adult Colon [id number] endoscope was introduced through the anus and advanced to the cecum, which was identified by both the appendix and ileocecal valve. No adverse events experienced.   The quality of the prep was good.  The instrument was then slowly withdrawn as the colon was fully examined.      COLON FINDINGS: A polypoid shaped sessile polyp measuring 15 mm in size was found at the cecum. residual polyp from 11/2013 removal.  A polypectomy was performed using snare cautery.  The resection was incomplete (mostly removed) and the polyp tissue was completely retrieved.  A saline Injection was given to lift the mucosal wall. Destruction of tissue via ablation was performed.  Argon plasma coagulation was used.  Care was given to ensure that the lumen was suctioned well.   Two diminutive sessile polyps were found in the rectum.  A polypectomy was performed with a cold snare.  The resection was complete and the polyp tissue was  completely retrieved.   Severe diverticulosis was noted in the sigmoid colon. The colon mucosa was otherwise normal.  Retroflexed views revealed no abnormalities. The time to cecum=4 minutes 0 seconds. Withdrawal time=23 minutes 0 seconds.  The scope was withdrawn and the procedure completed. COMPLICATIONS: There were no complications.  ENDOSCOPIC IMPRESSION: 1.   Sessile polyp measuring 15 mm in size was found at the cecum; polypectomy was performed using snare cautery; saline was given to lift the mucosal wall.; Destruction of tissue via ablation was performed also - Residual cecal polyp 2.   Two diminutive sessile polyps were found in the rectum; polypectomy was performed with a cold snare 3.   Severe diverticulosis was noted in the sigmoid colon 4.   The colon mucosa was otherwise normal - good prep  RECOMMENDATIONS: 1.  Hold aspirin, aspirin products, and anti-inflammatory medication for 2 weeks. 9/29 2.  Await pathology results 3.  Start colestipol 5 g daily w/ supper for chronic diarrhea  eSigned:  Gatha Mayer, MD, [facility] 06/30/2014 12:05 PM Revised: 06/30/2014 12:05 PM  cc:  The Patient

## 2014-07-01 ENCOUNTER — Ambulatory Visit (HOSPITAL_COMMUNITY)
Admission: RE | Admit: 2014-07-01 | Discharge: 2014-07-01 | Disposition: A | Discharge status: Home / Self Care | Payer: Medicare Other | Source: Ambulatory Visit | Attending: Cardiovascular Disease | Admitting: Cardiovascular Disease

## 2014-07-01 ENCOUNTER — Encounter (HOSPITAL_COMMUNITY): Payer: Self-pay | Admitting: Internal Medicine

## 2014-07-01 DIAGNOSIS — R600 Localized edema: Secondary | ICD-10-CM

## 2014-07-01 DIAGNOSIS — R609 Edema, unspecified: Secondary | ICD-10-CM | POA: Diagnosis present

## 2014-07-01 DIAGNOSIS — R0600 Dyspnea, unspecified: Secondary | ICD-10-CM

## 2014-07-01 DIAGNOSIS — I369 Nonrheumatic tricuspid valve disorder, unspecified: Secondary | ICD-10-CM

## 2014-07-01 DIAGNOSIS — R0609 Other forms of dyspnea: Secondary | ICD-10-CM

## 2014-07-01 DIAGNOSIS — R0602 Shortness of breath: Secondary | ICD-10-CM

## 2014-07-01 DIAGNOSIS — R0989 Other specified symptoms and signs involving the circulatory and respiratory systems: Secondary | ICD-10-CM

## 2014-07-01 DIAGNOSIS — Z8249 Family history of ischemic heart disease and other diseases of the circulatory system: Secondary | ICD-10-CM | POA: Diagnosis not present

## 2014-07-01 DIAGNOSIS — F17200 Nicotine dependence, unspecified, uncomplicated: Secondary | ICD-10-CM | POA: Diagnosis not present

## 2014-07-01 NOTE — Progress Notes (Signed)
2D Echocardiogram Complete.  07/01/2014   Carl Mcconnell, [place]

## 2014-07-02 ENCOUNTER — Encounter: Payer: Self-pay | Admitting: Internal Medicine

## 2014-07-02 NOTE — Progress Notes (Signed)
Quick Note:  Polyps all benign Colonoscopy recall 1 year please ______

## 2014-07-12 ENCOUNTER — Encounter: Payer: Self-pay | Admitting: Internal Medicine

## 2014-07-12 NOTE — Assessment & Plan Note (Signed)
-   spirometry 01/27/2014 >  FEV1  0.77 (25%) ratio 42 > trial off spiriva and on tudorza for ? GI side effects - better on anoro as of 6/11/5   Adequate control on present rx, reviewed > no change in rx needed

## 2014-07-14 ENCOUNTER — Ambulatory Visit: Payer: Medicare Other | Admitting: Internal Medicine

## 2014-07-17 ENCOUNTER — Ambulatory Visit (INDEPENDENT_AMBULATORY_CARE_PROVIDER_SITE_OTHER): Payer: Medicare Other | Admitting: Physician Assistant

## 2014-07-17 ENCOUNTER — Encounter: Payer: Self-pay | Admitting: Physician Assistant

## 2014-07-17 VITALS — BP 109/85 | HR 110 | Ht 66.0 in | Wt 125.8 lb

## 2014-07-17 DIAGNOSIS — Z79899 Other long term (current) drug therapy: Secondary | ICD-10-CM

## 2014-07-17 DIAGNOSIS — F17200 Nicotine dependence, unspecified, uncomplicated: Secondary | ICD-10-CM

## 2014-07-17 DIAGNOSIS — Z720 Tobacco use: Secondary | ICD-10-CM

## 2014-07-17 DIAGNOSIS — M79605 Pain in left leg: Secondary | ICD-10-CM

## 2014-07-17 DIAGNOSIS — I5033 Acute on chronic diastolic (congestive) heart failure: Secondary | ICD-10-CM

## 2014-07-17 DIAGNOSIS — I071 Rheumatic tricuspid insufficiency: Secondary | ICD-10-CM

## 2014-07-17 DIAGNOSIS — E038 Other specified hypothyroidism: Secondary | ICD-10-CM

## 2014-07-17 DIAGNOSIS — M79606 Pain in leg, unspecified: Secondary | ICD-10-CM

## 2014-07-17 DIAGNOSIS — R600 Localized edema: Secondary | ICD-10-CM

## 2014-07-17 DIAGNOSIS — M79604 Pain in right leg: Secondary | ICD-10-CM

## 2014-07-17 DIAGNOSIS — J449 Chronic obstructive pulmonary disease, unspecified: Secondary | ICD-10-CM

## 2014-07-17 LAB — BASIC METABOLIC PANEL
BUN: 20 mg/dL (ref 6–23)
CHLORIDE: 98 meq/L (ref 96–112)
CO2: 31 meq/L (ref 19–32)
CREATININE: 1.51 mg/dL — AB (ref 0.50–1.35)
Calcium: 9.9 mg/dL (ref 8.4–10.5)
Glucose, Bld: 88 mg/dL (ref 70–99)
Potassium: 5.1 mEq/L (ref 3.5–5.3)
Sodium: 140 mEq/L (ref 135–145)

## 2014-07-17 MED ORDER — POTASSIUM CHLORIDE CRYS ER 20 MEQ PO TBCR: 10.0000 meq | EXTENDED_RELEASE_TABLET | Freq: Every day | ORAL | Status: AC

## 2014-07-17 MED ORDER — FUROSEMIDE 40 MG PO TABS: 20.0000 mg | ORAL_TABLET | Freq: Every morning | ORAL | Status: AC

## 2014-07-17 NOTE — Patient Instructions (Addendum)
1.  Stop lasix(fluid pill) and potassium for two days then start 20 mg of lasix Daily and 10MEQ of potassium.   2.  Monitor weight daily.  If you gain 3 pounds 24 hours, or 5 pounds in a week, increase the lasix back to 40mg  until weight returns to baseline.  You also need to increase potassium to 20MEQ with the lasix. 3   We will check legs with ABI test. 4.  Follow up with Dr. Gwenlyn Found in three months.

## 2014-07-17 NOTE — Progress Notes (Signed)
Date:  07/18/2014   ID:  Carl Mcconnell, DOB [birth year], MRN [id number]  PCP:  Carl Pillow, NP  Primary Cardiologist:  Carl Mcconnell    History of Present Illness: Carl Mcconnell is a 80 y.o. male who presents today with his significant other of 30 years. He has 2 children and 3 grandchildren.  Past cardiovascular history profile is remarkable for greater than 60 pack years of tobacco abuse with ongoing smoking one pack per day. He has a positive family history of heart disease with a brother who died of a heart attack at age 39 her mother in her later years of age 78. He has never had a heart attack but has had a "mini stroke. He does have COPD and is followed by Dr. Melvyn Mcconnell.  He does have a right bundle branch block. Had colonoscopy which revealed a polyp , it was thought that this was only removable by partial colectomy however, because of his severe COPD, Dr. Carlean Mcconnell agreed to attempt colonoscopic removal.  Dr Carl Mcconnell saw him recently for new pitting LEE  He was placed on a low-dose diuretic by his primary care physician with some improvement.  Dr. Gwenlyn Mcconnell increased his lasix to 40mg  and the patient presents today for follow up.  He had an echo preformed(below) which indicates normal LVF and only G1DD, however, his tricuspid valve annulus is dilated to 5cm and is likely the reason for his recent volume overload.  He presents for follow up today.  His weight has decreased from 147# to 125.  And edema has improved.  He is reporting bilateral LE pain particularly when he dorsiflexes his feet.  He also states that his feet are getting cold and have turned purple recently.  His wife says the color has improved but they are still discolored.  The edema persists despite all the weight loss.   The patient currently denies nausea, vomiting, fever, chest pain, shortness of breath beyond baseline, orthopnea, dizziness, PND, cough, congestion, abdominal pain, hematochezia, melena.   Study Conclusions  - Left  ventricle: The cavity size was normal. Wall thickness was normal. Systolic function was normal. The estimated ejection fraction was in the range of 55% to 60%. Doppler parameters are consistent with abnormal left ventricular relaxation (grade 1 diastolic dysfunction). The E/e&' ratio is <8, suggesting normal LV filling pressure. - Ventricular septum: D-shaped septum with incoordinate motion - suggesting pressure/volume overload of the RV. - Mitral valve: Calcified annulus. - Left atrium: LA Volume/BSA= 14.7 ml/m2. The atrium was normal in size. - Right ventricle: The cavity size was mildly dilated. Systolic function was normal. - Right atrium: Severely dilated. - Tricuspid valve: Dilated annulus at 5 cm. There was moderate regurgitation. - Pulmonary arteries: PA peak pressure: 30 mm Hg (S).  Impressions:  - Compared to the prior echo in 02/2014, the RVSP appears lower. .  Wt Readings from Last 3 Encounters:  07/17/14 125 lb 12.8 oz (57.063 kg)  06/30/14 147 lb (66.679 kg)  06/30/14 147 lb (66.679 kg)     Past Medical History  Diagnosis Date  . Hypothyroidism   . Colon polyp   . Anemia   . ED (erectile dysfunction)   . COPD (chronic obstructive pulmonary disease)   . Shortness of breath   . Stroke 1998    vision trouble  . Degenerative disc disease     LS  . Cholecystitis 10/04/2012  . 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 Right bundle  branch block   . Dyspnea on exertion   . Family history of heart disease   . Edema extremities     bilateral lower extremities-"was recently placed on fluid pill"    Current Outpatient Prescriptions  Medication Sig Dispense Refill  . aspirin 81 MG tablet Take 1 tablet (81 mg total) by mouth daily. STOP UNTIL July 14, 2014  30 tablet    . colestipol (COLESTID) 5 G packet Take 5 g by mouth daily with supper.  30 each  12  . ferrous sulfate 325 (65 FE) MG tablet Take 325 mg by mouth every other day.      .  furosemide (LASIX) 40 MG tablet Take 0.5 tablets (20 mg total) by mouth every morning. 06-17-14 recently started this  30 tablet  6  . levothyroxine (SYNTHROID, LEVOTHROID) 175 MCG tablet Take 175 mcg by mouth daily before breakfast.      . potassium chloride SA (K-DUR,KLOR-CON) 20 MEQ tablet Take 0.5 tablets (10 mEq total) by mouth daily.  30 tablet  6  . Umeclidinium-Vilanterol (ANORO ELLIPTA) 62.5-25 MCG/INH AEPB Inhale 1 puff into the lungs daily.  28 each  11  . vitamin B-12 (CYANOCOBALAMIN) 1000 MCG tablet Take 1,000 mcg by mouth daily.       No current facility-administered medications for this visit.    Allergies:    Allergies  Allergen Reactions  . Chantix [Varenicline]     REACTION: ---in HIGH dosage (patient can take lower dose) Caused eyes out of focus, h/a,nausea and achiness     Social History:  The patient  reports that he has been smoking Cigarettes.  He has a 64 pack-year smoking history. He has never used smokeless tobacco. He reports that he does not drink alcohol or use illicit drugs.   Family history:   Family History  Problem Relation Age of Onset  . Heart disease Mother     CAD  . Stroke Father   . Diabetes Sister   . Hypertension Sister   . Heart disease Brother     MI  . Colon cancer Neg Hx     ROS:  Please see the history of present illness.  All other systems reviewed and negative.   PHYSICAL EXAM: VS:  BP 109/85  Pulse 110  Ht 5\' 6"  (1.676 m)  Wt 125 lb 12.8 oz (57.063 kg)  BMI 20.31 kg/m2 Thin, well developed, in no acute distress.   HEENT: Pupils are equal round react to light accommodation extraocular movements are intact.  Neck: no JVDNo cervical lymphadenopathy. Cardiac: Regular rate and rhythm without murmurs rubs or gallops. Lungs:  clear to auscultation bilaterally, no wheezing, rhonchi or rales.  Normal respirations Abd: soft, nontender, positive bowel sounds all quadrants, no hepatosplenomegaly Ext: 1-2+ bilateral pitting edeama.  2+  radial and 1+ dorsalis pedis, PT pulses. Skin: warm and dry Neuro:  Grossly normal  EKG:  Sinus tach 110, RBBB, Atrial enlargement  ASSESSMENT AND PLAN:  Problem List Items Addressed This Visit   Acute on chronic diastolic congestive heart failure, NYHA class 2      The patient has had considerable improvement in volume status in the last three weeks.  His weight dropped from 147# to 125.  Bmet revealed a SCr of 1.51 and that is the only one charted.  He will stop the lasix and potassium for two days then start 20 mg of lasix Daily and 10MEQ of potassium.  Monitor weight daily and increase lasix if he has  an increase of 3# in 24 hours of 5 in a week.  He will increase potassium to 20 meq if he takes more lasix.  We will recheck a BMET next week.   I am concerned that his TV shows moderate regurg and in May it was mild.  There was no dimension charted for the annulus diameter in May however, the recent echo was 5cm.  The change in the right atrium was from mild to severe dilation.  Peak PA pressure in May was 24mmHg.  Other than now being tachycardic(110) he has no acute EKG changes compared to in April.  He appears comfortable in the clinic.  Not tachypneic.  PE is a consideration but could be related to pulm htn from years of tobacco abuse, but an acute change? RV systolic function is normal.     Relevant Medications      furosemide (LASIX) tablet   COPD GOLD IV   Lower extremity edema   Lower extremity pain, bilateral     The patient is reporting bilateral LE pain particularly when he dorsiflexes his feet.  He also states that his feet are getting cold and have turned purple recently.  His wife says the color has improved but they are still uniformly purple on exam. Pulses are mildly diminished.  More so on the right.  Will check ABIs.   Edema is present bilaterally and equal despite loosing 22 pounds.  Will check a ddimer.      Moderate tricuspid regurgitation     See below.    Relevant  Medications      furosemide (LASIX) tablet   Smoker    Other Visit Diagnoses   Other specified hypothyroidism    -  Primary    Relevant Orders       EKG 12-Lead    Pain of lower extremity, unspecified laterality        Relevant Orders       Lower Extremity Arterial Doppler    Polypharmacy        Relevant Orders       Basic metabolic panel

## 2014-07-18 DIAGNOSIS — M79605 Pain in left leg: Secondary | ICD-10-CM

## 2014-07-18 DIAGNOSIS — M79604 Pain in right leg: Secondary | ICD-10-CM | POA: Insufficient documentation

## 2014-07-18 DIAGNOSIS — I071 Rheumatic tricuspid insufficiency: Secondary | ICD-10-CM | POA: Insufficient documentation

## 2014-07-18 DIAGNOSIS — I5033 Acute on chronic diastolic (congestive) heart failure: Secondary | ICD-10-CM | POA: Insufficient documentation

## 2014-07-18 NOTE — Assessment & Plan Note (Signed)
See below

## 2014-07-18 NOTE — Assessment & Plan Note (Addendum)
The patient has had considerable improvement in volume status in the last three weeks.  His weight dropped from 147# to 125.  Bmet revealed a SCr of 1.51 and that is the only one charted.  He will stop the lasix and potassium for two days then start 20 mg of lasix Daily and 10MEQ of potassium.  Monitor weight daily and increase lasix if he has an increase of 3# in 24 hours of 5 in a week.  He will increase potassium to 20 meq if he takes more lasix.  We will recheck a BMET next week.   I am concerned that his TV shows moderate regurg and in May it was mild.  There was no dimension charted for the annulus diameter in May however, the recent echo was 5cm.  The change in the right atrium was from mild to severe dilation.  Peak PA pressure in May was 40mmHg.  Other than now being tachycardic(110) he has no acute EKG changes compared to in April.  He appears comfortable in the clinic.  Not tachypneic.  PE is a consideration but could be related to pulm htn from years of tobacco abuse, but an acute change? RV systolic function is normal.   07/19/14:  Ddimer was elevated at 0.78.  Will arrange for CT angiogram on Monday

## 2014-07-18 NOTE — Assessment & Plan Note (Addendum)
The patient is reporting bilateral LE pain particularly when he dorsiflexes his feet.  He also states that his feet are getting cold and have turned purple recently.  His wife says the color has improved but they are still uniformly purple on exam. Pulses are mildly diminished.  More so on the right.  Will check ABIs.   Edema is present bilaterally and equal despite loosing 22 pounds.  Will check a ddimer.

## 2014-07-19 ENCOUNTER — Other Ambulatory Visit: Payer: Self-pay | Admitting: Physician Assistant

## 2014-07-19 DIAGNOSIS — R600 Localized edema: Secondary | ICD-10-CM

## 2014-07-19 DIAGNOSIS — N179 Acute kidney failure, unspecified: Secondary | ICD-10-CM

## 2014-07-19 DIAGNOSIS — I071 Rheumatic tricuspid insufficiency: Secondary | ICD-10-CM

## 2014-07-19 LAB — BASIC METABOLIC PANEL
BUN: 20 mg/dL (ref 6–23)
CHLORIDE: 99 meq/L (ref 96–112)
CO2: 32 mEq/L (ref 19–32)
Calcium: 9.6 mg/dL (ref 8.4–10.5)
Creat: 1.36 mg/dL — ABNORMAL HIGH (ref 0.50–1.35)
Glucose, Bld: 144 mg/dL — ABNORMAL HIGH (ref 70–99)
POTASSIUM: 4.3 meq/L (ref 3.5–5.3)
Sodium: 142 mEq/L (ref 135–145)

## 2014-07-19 LAB — D-DIMER, QUANTITATIVE (NOT AT ARMC): D-Dimer, Quant: 0.78 ug/mL-FEU — ABNORMAL HIGH (ref 0.00–0.48)

## 2014-07-20 ENCOUNTER — Ambulatory Visit
Admission: RE | Admit: 2014-07-20 | Discharge: 2014-07-20 | Disposition: A | Discharge status: Home / Self Care | Payer: Medicare Other | Source: Ambulatory Visit | Attending: Cardiovascular Disease | Admitting: Cardiovascular Disease

## 2014-07-20 ENCOUNTER — Telehealth: Payer: Self-pay | Admitting: Cardiology

## 2014-07-20 ENCOUNTER — Telehealth: Payer: Self-pay | Admitting: *Deleted

## 2014-07-20 DIAGNOSIS — R0602 Shortness of breath: Secondary | ICD-10-CM

## 2014-07-20 DIAGNOSIS — R7989 Other specified abnormal findings of blood chemistry: Secondary | ICD-10-CM

## 2014-07-20 MED ORDER — IOHEXOL 350 MG/ML SOLN
100.0000 mL | Freq: Once | INTRAVENOUS | Status: AC | PRN
  Administered 2014-07-20: 100 mL via INTRAVENOUS

## 2014-07-20 NOTE — Telephone Encounter (Signed)
Called by [facility] Radiology for CT angio of chest - negative for PE, severe emphysema, multiple hepatic lesions severe in density up to 4.4cm x 2.3cm x 4.7cm.  Please see final report for full details and recommendations

## 2014-07-20 NOTE — Telephone Encounter (Signed)
Message copied by Chauncy Lean on Mon Jul 20, 2014 11:46 AM ------      Message from: Brett Canales      Created: Sun Jul 19, 2014  1:38 PM      Regarding: Elevated Ddimer       Curt Bears,      Please arrange, or have someone arrange, a CT angiogram of the chest for Mr. Trevino ASAP.  Ddimer 0.78.  Acute changes on his recent echo.            Thanks,      Gaspar Bidding ------

## 2014-07-20 NOTE — Telephone Encounter (Signed)
I spoke with Carl Mcconnell and made her aware of the need for the CT.  She is agreeable to transport patient when needed.    I will call to schedule the CT

## 2014-07-20 NOTE — Telephone Encounter (Signed)
I spoke with patient about his lab results.  He asked me to speak with June, his caretaker, and arrange a time for the CT.  Her number is [phone].  I called her, but no answer and no voicemail.

## 2014-07-20 NOTE — Telephone Encounter (Signed)
I got the CT scheduled for 5:15 today at [place].  June aware and verbalized understanding.

## 2014-07-20 NOTE — Telephone Encounter (Signed)
I called and spoke with June- Mr Ludlam's CT was negative for PE. He does have COPD and some liver disease with ascites and hepatic lesions (atypical for neoplasm). She indicated that Dr Carlean Purl and Dr Melvyn Novas both follow him and that these findings are not new.   Kerin Ransom PA-C 07/20/2014 6:46 PM

## 2014-07-20 NOTE — Telephone Encounter (Signed)
Returning your call. °

## 2014-07-22 ENCOUNTER — Encounter: Payer: Self-pay | Admitting: Internal Medicine

## 2014-07-22 ENCOUNTER — Ambulatory Visit (INDEPENDENT_AMBULATORY_CARE_PROVIDER_SITE_OTHER): Payer: Medicare Other | Admitting: Internal Medicine

## 2014-07-22 VITALS — BP 98/62 | HR 96 | Temp 97.6°F | Ht 66.0 in | Wt 126.8 lb

## 2014-07-22 DIAGNOSIS — F17200 Nicotine dependence, unspecified, uncomplicated: Secondary | ICD-10-CM

## 2014-07-22 DIAGNOSIS — J449 Chronic obstructive pulmonary disease, unspecified: Secondary | ICD-10-CM

## 2014-07-22 DIAGNOSIS — Z720 Tobacco use: Secondary | ICD-10-CM

## 2014-07-22 MED ORDER — TIOTROPIUM BROMIDE-OLODATEROL 2.5-2.5 MCG/ACT IN AERS: 2.0000 | INHALATION_SPRAY | Freq: Every day | RESPIRATORY_TRACT | Status: AC

## 2014-07-22 NOTE — Progress Notes (Signed)
Subjective:    Patient ID: Carl Mcconnell, male    DOB: [birth year]   MRN: [id number]   Brief patient profile:  51 yowm active smoker with dx of copd requiring colon surgery and requested pulmonary eval for  01/27/2014  for surgical clearance by Dr Brantley Stage proved to have GOLD IV COPD 01/27/14   Cardiology = Dr Gwenlyn Found GI = Carlean Purl Primary = [facility]   History of Present Illness  01/27/2014 1st [facility] office visit/ Melvyn Novas / active smoker/ maint on spiriva longterm Chief Complaint  Patient presents with  . Pulmonary Consult    Referred per Dr. Olevia Perches for pulmonary clearance for colon surgery. Pt states dxed with COPD approx 4 yrs ago.  He c/o DOE progressively worse for the past month. He also c/o cough- prod with yellow sputum.  sob indolent onset progressive to point where only x 50 ft - has to sit down on porch and recover after walking to  mail box  abd swelling / freq bms/ can't breath when bends over or belt tight Cough worse in am's All labs and xrays/ekgs at [facility] nl per pt > requested rec I will obtain [facility] records and discuss with Drs Brantley Stage why you may be loosing wt and what the risks of the surgery may be  In meantime you need to: 1) Stop spiriva and start tudorza one twice daily  2) Stop all smoking - this is the most important aspect of your care    02/10/2014 f/u ov/Naliya Gish re: COPD GOLD IV on tudorza Chief Complaint  Patient presents with  . Follow-up    Pt reports that his breathing has improved some since last visit. He feels that he does not give out as easy. Appetite also some better.   Not limited by breathing from desired activities   Mailbox and back doe  is better. Cutting down on smoking  rec Continue tudorza for now but rinse and gargle after use  The key is to stop smoking completely before smoking completely stops you!    03/26/2014 f/u ov/Evonne Rinks re: gold iv copd  Chief Complaint  Patient presents with  . Follow-up    Pt states that  his breathing is about the same- some worse with humidity  feels anoro helps the most with doe mailbox and back  rec Continue anoro one puff each am - smooth deep drag x 2 for each Click    81/0/1751 f/u ov/Jaydrian Corpening re:  GOLD IV COPD  Chief Complaint  Patient presents with  . Follow-up    Pt reports his breathing is unchanged. No new co's today.    has to stop when walking due to L > R leg  Pain, not sob   No obvious day to day or daytime variabilty or assoc chronic cough or cp or chest tightness, subjective wheeze overt sinus or hb symptoms. No unusual exp hx or h/o childhood pna/ asthma or knowledge of premature birth.  Sleeping ok without nocturnal  or early am exacerbation  of respiratory  c/o's or need for noct saba. Also denies any obvious fluctuation of symptoms with weather or environmental changes or other aggravating or alleviating factors except as outlined above   Current Medications, Allergies, Complete Past Medical History, Past Surgical History, Family History, and Social History were reviewed in [employer] record.  ROS  The following are not active complaints unless bolded sore throat, dysphagia, dental problems, itching, sneezing,  nasal congestion or excess/ purulent secretions, ear  ache,   fever, chills, sweats, unintended wt loss, pleuritic or exertional cp, hemoptysis,  orthopnea pnd or leg swelling, presyncope, palpitations, heartburn, abdominal pain, anorexia, nausea, vomiting, diarrhea  or change in bowel or urinary habits, change in stools or urine, dysuria,hematuria,  rash, arthralgias, visual complaints, headache, numbness weakness or ataxia or problems with walking or coordination,  change in mood/affect or memory.              Objective:   Physical Exam  02/10/2014        141 > 03/26/2014  138 > 07/22/2014  126  Wt Readings from Last 3 Encounters:  01/27/14 139 lb 12.8 oz (63.413 kg)  01/05/14 138 lb 12.8 oz (62.959 kg)  12/09/13 154 lb  (69.854 kg)      HEENT mild turbinate edema.  Oropharynx no thrush or excess pnd or cobblestoning.  No JVD or cervical adenopathy. Mild accessory muscle hypertrophy. Trachea midline, nl thryroid. Chest was hyperinflated by percussion with diminished breath sounds and moderate increased exp time without wheeze. Hoover sign positive at mid inspiration. Regular rate and rhythm without murmur gallop or rub or increase P2 or edema.  Abd: no hsm, nl excursion. Ext warm without cyanosis or clubbing.     Labs 01/07/14 nl cbc, tsh, cmet (except alk phos 147) cxr 01/07/14 copd with marked hyperinflation but not acute process   CTa 07/20/14 1. No evidence for pulmonary embolus.  2. Moderate severe emphysema.  3. Multiple hepatic lesions severe in density. The largest lesion  measures 4.4 x 2.3 x 4.7 cm. This low-density lesion may represent  an abscess or remote hematoma. This is atypical for neoplasm.  Additional hypodense areas are noted. These could be vascular or  related to metastatic disease. Recommend CT of the abdomen and  pelvis with contrast for further evaluation. MRI the liver would be  useful to evaluate the liver discretely.  4. Abdominal ascites       Assessment & Plan:   Outpatient Encounter Prescriptions as of 07/22/2014  Medication Sig  . aspirin 81 MG tablet Take 1 tablet (81 mg total) by mouth daily. STOP UNTIL July 14, 2014  . colestipol (COLESTID) 5 G packet Take 5 g by mouth daily with supper.  . ferrous sulfate 325 (65 FE) MG tablet Take 325 mg by mouth every other day.  . furosemide (LASIX) 40 MG tablet Take 0.5 tablets (20 mg total) by mouth every morning. 06-17-14 recently started this  . levothyroxine (SYNTHROID, LEVOTHROID) 175 MCG tablet Take 175 mcg by mouth daily before breakfast.  . potassium chloride SA (K-DUR,KLOR-CON) 20 MEQ tablet Take 0.5 tablets (10 mEq total) by mouth daily.  . vitamin B-12 (CYANOCOBALAMIN) 1000 MCG tablet Take 1,000 mcg by mouth daily.   . [DISCONTINUED] Umeclidinium-Vilanterol (ANORO ELLIPTA) 62.5-25 MCG/INH AEPB Inhale 1 puff into the lungs daily.  . Tiotropium Bromide-Olodaterol (STIOLTO RESPIMAT) 2.5-2.5 MCG/ACT AERS Inhale 2 puffs into the lungs daily.

## 2014-07-22 NOTE — Patient Instructions (Signed)
The key is to stop smoking completely before smoking completely stops you!   Try stiolto 2 puffs each am instead of anoro and if you like it fill the prescription if not resume the anoro   If you are satisfied with your treatment plan,  let your doctor know and he/she can either refill your medications or you can return here when your prescription runs out.     If in any way you are not 100% satisfied,  please tell us.  If 100% better, tell your friends!  Pulmonary follow up is as needed

## 2014-07-23 NOTE — Assessment & Plan Note (Signed)

## 2014-07-23 NOTE — Assessment & Plan Note (Signed)
-   spirometry 01/27/2014 >  FEV1  0.77 (25%) ratio 42 > trial off spiriva and on tudorza for ? GI side effects - better on anoro as of 6/11/5   The proper method of use, as well as anticipated side effects, of a metered-dose inhaler are discussed and demonstrated to the patient. Improved effectiveness after extensive coaching during this visit to a level of approximately  90% with respimat device   I had an extended discussion with the patient today lasting 15 to 20 minutes of a 25 minute visit on the following issues:  Really nothing else to offer here if keeps smoking but worth trying the lama/laba respimat format to see if makes any differenct

## 2014-07-24 ENCOUNTER — Ambulatory Visit (HOSPITAL_COMMUNITY)
Admission: RE | Admit: 2014-07-24 | Discharge: 2014-07-24 | Disposition: A | Discharge status: Home / Self Care | Payer: Medicare Other | Source: Ambulatory Visit | Attending: Cardiology | Admitting: Cardiology

## 2014-07-24 DIAGNOSIS — M79606 Pain in leg, unspecified: Secondary | ICD-10-CM | POA: Insufficient documentation

## 2014-07-24 NOTE — Progress Notes (Signed)
Arterial Lower Ext. Duplex Completed. Ayodele Sangalang, BS, RDMS, RVT  

## 2014-07-31 ENCOUNTER — Other Ambulatory Visit: Payer: Self-pay

## 2014-08-14 ENCOUNTER — Other Ambulatory Visit: Payer: Self-pay

## 2014-08-14 DIAGNOSIS — R944 Abnormal results of kidney function studies: Secondary | ICD-10-CM

## 2014-08-17 ENCOUNTER — Other Ambulatory Visit (INDEPENDENT_AMBULATORY_CARE_PROVIDER_SITE_OTHER): Payer: Medicare Other

## 2014-08-17 DIAGNOSIS — R944 Abnormal results of kidney function studies: Secondary | ICD-10-CM

## 2014-08-17 LAB — BASIC METABOLIC PANEL
BUN: 17 mg/dL (ref 6–23)
CALCIUM: 9 mg/dL (ref 8.4–10.5)
CO2: 29 meq/L (ref 19–32)
Chloride: 102 mEq/L (ref 96–112)
Creatinine, Ser: 1.5 mg/dL (ref 0.4–1.5)
GFR: 48.32 mL/min — AB (ref >60.00)
GLUCOSE: 113 mg/dL — AB (ref 70–99)
POTASSIUM: 4.1 meq/L (ref 3.5–5.1)
Sodium: 138 mEq/L (ref 135–145)

## 2014-08-18 NOTE — Progress Notes (Signed)
Quick Note:  Not sure I ordered this Am ccing Dr. Gwenlyn Found - I think he did ______

## 2014-08-24 ENCOUNTER — Telehealth: Payer: Self-pay

## 2014-08-24 DIAGNOSIS — K769 Liver disease, unspecified: Secondary | ICD-10-CM

## 2014-08-24 NOTE — Telephone Encounter (Signed)
BMET returned 11/2 - I did not recall ordering         Borderline renal fx for CT or MR it seems        Needs either MR liver or CT abd with contrast (if radiology thinks BUN/creat ok) to evaluate liver lesions        Please check criteria and schedule        If ok for CT would do that            ----- Message -----     From: Gatha Mayer, MD     Sent: 08/13/2014  7:42 PM      To: Chauncy Lean, RN, Gatha Mayer, MD, *        Catching up on this        Will call him Carl Mcconnell will) from office and have him get a BMET again to see what kidney fx is before arranging next test to evaluate liver lesions seen on his CT chest        CEG    ----- Message -----     From: Chauncy Lean, RN     Sent: 07/22/2014 12:52 PM      To: Gatha Mayer, MD        Please see CT report about liver lesions and advise patient.        Thank you,    Brita Romp RN [place], [place]    [phone]        I have clarified with Dr. Carlean Purl.  He does want CT abd and pelvis not just CT of the liver.  See CT angio report with radiologist recommendations.   Left message for patient to call back CT scan has been scheduled for 09/06/14 1:30 at [facility]

## 2014-08-25 NOTE — Telephone Encounter (Signed)
Patient's wife notified She will come pick up the contrast and instructions

## 2014-08-26 ENCOUNTER — Emergency Department (HOSPITAL_COMMUNITY)
Admission: EM | Admit: 2014-08-26 | Discharge: 2014-09-15 | Disposition: E | Payer: Medicare Other | Attending: Emergency Medicine | Admitting: Emergency Medicine

## 2014-08-26 ENCOUNTER — Encounter (HOSPITAL_COMMUNITY): Payer: Self-pay

## 2014-08-26 ENCOUNTER — Ambulatory Visit (INDEPENDENT_AMBULATORY_CARE_PROVIDER_SITE_OTHER)
Admission: RE | Admit: 2014-08-26 | Discharge: 2014-08-26 | Disposition: A | Discharge status: Home / Self Care | Payer: Medicare Other | Source: Ambulatory Visit | Attending: Internal Medicine | Admitting: Internal Medicine

## 2014-08-26 ENCOUNTER — Telehealth: Payer: Self-pay | Admitting: *Deleted

## 2014-08-26 ENCOUNTER — Other Ambulatory Visit: Payer: Self-pay | Admitting: Internal Medicine

## 2014-08-26 ENCOUNTER — Emergency Department (HOSPITAL_COMMUNITY): Payer: Medicare Other

## 2014-08-26 DIAGNOSIS — E039 Hypothyroidism, unspecified: Secondary | ICD-10-CM | POA: Diagnosis not present

## 2014-08-26 DIAGNOSIS — I469 Cardiac arrest, cause unspecified: Secondary | ICD-10-CM | POA: Insufficient documentation

## 2014-08-26 DIAGNOSIS — Z720 Tobacco use: Secondary | ICD-10-CM | POA: Diagnosis not present

## 2014-08-26 DIAGNOSIS — K7689 Other specified diseases of liver: Secondary | ICD-10-CM

## 2014-08-26 DIAGNOSIS — K769 Liver disease, unspecified: Secondary | ICD-10-CM

## 2014-08-26 DIAGNOSIS — Z86010 Personal history of colonic polyps: Secondary | ICD-10-CM | POA: Insufficient documentation

## 2014-08-26 DIAGNOSIS — Z8739 Personal history of other diseases of the musculoskeletal system and connective tissue: Secondary | ICD-10-CM | POA: Insufficient documentation

## 2014-08-26 DIAGNOSIS — Z862 Personal history of diseases of the blood and blood-forming organs and certain disorders involving the immune mechanism: Secondary | ICD-10-CM | POA: Diagnosis not present

## 2014-08-26 DIAGNOSIS — Z7982 Long term (current) use of aspirin: Secondary | ICD-10-CM | POA: Diagnosis not present

## 2014-08-26 DIAGNOSIS — R1110 Vomiting, unspecified: Secondary | ICD-10-CM | POA: Diagnosis not present

## 2014-08-26 DIAGNOSIS — Z79899 Other long term (current) drug therapy: Secondary | ICD-10-CM | POA: Diagnosis not present

## 2014-08-26 DIAGNOSIS — Z8673 Personal history of transient ischemic attack (TIA), and cerebral infarction without residual deficits: Secondary | ICD-10-CM | POA: Insufficient documentation

## 2014-08-26 DIAGNOSIS — J449 Chronic obstructive pulmonary disease, unspecified: Secondary | ICD-10-CM | POA: Insufficient documentation

## 2014-08-26 LAB — BASIC METABOLIC PANEL
Anion gap: 20 — ABNORMAL HIGH (ref 5–15)
BUN: 18 mg/dL (ref 6–23)
CO2: 20 meq/L (ref 19–32)
Calcium: 8.8 mg/dL (ref 8.4–10.5)
Chloride: 97 mEq/L (ref 96–112)
Creatinine, Ser: 1.33 mg/dL (ref 0.50–1.35)
GFR calc Af Amer: 57 mL/min — ABNORMAL LOW (ref >90)
GFR calc non Af Amer: 49 mL/min — ABNORMAL LOW (ref >90)
Glucose, Bld: 138 mg/dL — ABNORMAL HIGH (ref 70–99)
Potassium: 3.9 mEq/L (ref 3.7–5.3)
Sodium: 137 mEq/L (ref 137–147)

## 2014-08-26 LAB — I-STAT CHEM 8, ED
BUN: 20 mg/dL (ref 6–23)
Calcium, Ion: 1.05 mmol/L — ABNORMAL LOW (ref 1.13–1.30)
Chloride: 100 mEq/L (ref 96–112)
Creatinine, Ser: 1.3 mg/dL (ref 0.50–1.35)
GLUCOSE: 139 mg/dL — AB (ref 70–99)
HCT: 48 % (ref 39.0–52.0)
HEMOGLOBIN: 16.3 g/dL (ref 13.0–17.0)
POTASSIUM: 3.6 meq/L — AB (ref 3.7–5.3)
SODIUM: 136 meq/L — AB (ref 137–147)
TCO2: 20 mmol/L (ref 0–100)

## 2014-08-26 LAB — I-STAT ARTERIAL BLOOD GAS, ED
Acid-base deficit: 13 mmol/L — ABNORMAL HIGH (ref 0.0–2.0)
Bicarbonate: 15.2 mEq/L — ABNORMAL LOW (ref 20.0–24.0)
O2 Saturation: 100 %
PCO2 ART: 44.4 mmHg (ref 35.0–45.0)
PH ART: 7.142 — AB (ref 7.350–7.450)
PO2 ART: 357 mmHg — AB (ref 80.0–100.0)
Patient temperature: 98.6
TCO2: 17 mmol/L (ref 0–100)

## 2014-08-26 LAB — CBC
HEMATOCRIT: 44.4 % (ref 39.0–52.0)
HEMOGLOBIN: 14.7 g/dL (ref 13.0–17.0)
MCH: 29.3 pg (ref 26.0–34.0)
MCHC: 33.1 g/dL (ref 30.0–36.0)
MCV: 88.6 fL (ref 78.0–100.0)
Platelets: 147 10*3/uL — ABNORMAL LOW (ref 150–400)
RBC: 5.01 MIL/uL (ref 4.22–5.81)
RDW: 17.4 % — ABNORMAL HIGH (ref 11.5–15.5)
WBC: 10.3 10*3/uL (ref 4.0–10.5)

## 2014-08-26 LAB — I-STAT TROPONIN, ED: Troponin i, poc: 0 ng/mL (ref 0.00–0.08)

## 2014-08-26 LAB — I-STAT CG4 LACTIC ACID, ED: LACTIC ACID, VENOUS: 6.93 mmol/L — AB (ref 0.5–2.2)

## 2014-08-26 MED ORDER — FENTANYL BOLUS VIA INFUSION
50.0000 ug | INTRAVENOUS | Status: DC | PRN
  Filled 2014-08-26: qty 50

## 2014-08-26 MED ORDER — FENTANYL CITRATE 0.05 MG/ML IJ SOLN: 100.0000 ug | Freq: Once | INTRAMUSCULAR | Status: DC

## 2014-08-26 MED ORDER — SODIUM CHLORIDE 0.9 % IV SOLN
2000.0000 mL | Freq: Once | INTRAVENOUS | Status: AC
  Administered 2014-08-26: 2000 mL via INTRAVENOUS

## 2014-08-26 MED ORDER — MIDAZOLAM HCL 5 MG/ML IJ SOLN: 2.0000 mg | Freq: Once | INTRAMUSCULAR | Status: DC

## 2014-08-26 MED ORDER — SODIUM CHLORIDE 0.9 % IV SOLN
1.0000 mg/h | INTRAVENOUS | Status: DC
  Filled 2014-08-26: qty 10

## 2014-08-26 MED ORDER — EPINEPHRINE HCL 0.1 MG/ML IJ SOSY
PREFILLED_SYRINGE | INTRAMUSCULAR | Status: DC | PRN
  Administered 2014-08-26 (×3): 1 via INTRAVENOUS

## 2014-08-26 MED ORDER — SODIUM CHLORIDE 0.9 % IV SOLN
25.0000 ug/h | INTRAVENOUS | Status: DC
  Filled 2014-08-26: qty 50

## 2014-08-26 MED ORDER — ETOMIDATE 2 MG/ML IV SOLN
10.0000 mg | Freq: Once | INTRAVENOUS | Status: AC
  Administered 2014-08-26: 10 mg via INTRAVENOUS

## 2014-08-26 MED ORDER — NOREPINEPHRINE BITARTRATE 1 MG/ML IV SOLN
0.5000 ug/min | INTRAVENOUS | Status: DC
  Administered 2014-08-26: 10 ug/min via INTRAVENOUS
  Filled 2014-08-26: qty 4

## 2014-08-26 MED ORDER — SODIUM CHLORIDE 0.9 % IV SOLN
1.0000 ug/kg/min | INTRAVENOUS | Status: DC
  Filled 2014-08-26: qty 20

## 2014-08-26 MED ORDER — MIDAZOLAM HCL 5 MG/ML IJ SOLN: 2.0000 mg | Freq: Once | INTRAMUSCULAR | Status: DC | PRN

## 2014-08-26 MED ORDER — AMIODARONE HCL 150 MG/3ML IV SOLN
150.0000 mg | INTRAVENOUS | Status: DC | PRN
  Administered 2014-08-26: 150 mg via INTRAVENOUS

## 2014-08-26 MED ORDER — ASPIRIN 300 MG RE SUPP: 300.0000 mg | RECTAL | Status: DC

## 2014-08-26 MED ORDER — IOHEXOL 300 MG/ML  SOLN: 100.0000 mL | Freq: Once | INTRAMUSCULAR | Status: AC | PRN

## 2014-08-26 MED ORDER — MIDAZOLAM BOLUS VIA INFUSION
2.0000 mg | INTRAVENOUS | Status: DC | PRN
  Filled 2014-08-26: qty 2

## 2014-08-26 MED ORDER — FENTANYL CITRATE 0.05 MG/ML IJ SOLN: 100.0000 ug | Freq: Once | INTRAMUSCULAR | Status: DC | PRN

## 2014-08-26 MED ORDER — CISATRACURIUM BOLUS VIA INFUSION
0.0500 mg/kg | INTRAVENOUS | Status: DC | PRN
  Filled 2014-08-26: qty 4

## 2014-08-26 MED ORDER — ROCURONIUM BROMIDE 50 MG/5ML IV SOLN
70.0000 mg | Freq: Once | INTRAVENOUS | Status: AC
  Administered 2014-08-26: 70 mg via INTRAVENOUS

## 2014-08-26 MED ORDER — CISATRACURIUM BOLUS VIA INFUSION
0.1000 mg/kg | Freq: Once | INTRAVENOUS | Status: DC
Start: 2014-08-26 — End: 2014-08-26
  Filled 2014-08-26: qty 7

## 2014-08-26 MED FILL — Medication: Qty: 1 | Status: AC

## 2014-08-27 NOTE — ED Provider Notes (Signed)
CSN: [id number]     Arrival date & time 09-09-2014  1430 History   First MD Initiated Contact with Patient 09/09/2014 1518     Chief Complaint  Patient presents with  . Cardiac Arrest     (Consider location/radiation/quality/duration/timing/severity/associated sxs/prior Treatment) HPI Comments: ROSC after 10 minutes of CPR. Concern for aspiration per EMS.  Patient is a 80 y.o. male presenting with general illness. The history is provided by the EMS personnel.  Illness Location:  Went unresponsive while getting imaging Quality:  Unresponsive Severity:  Severe Onset quality:  Sudden Duration: minutes. Timing:  Constant Progression:  Unchanged Chronicity:  New Context:  Was getting imaging at [facility], went unresponsive. ROSC after 10 minutes of CPR Relieved by:  Nothing Worsened by:  Nothing Associated symptoms: vomiting   Associated symptoms: no fever     Past Medical History  Diagnosis Date  . Hypothyroidism   . Colon polyp   . Anemia   . ED (erectile dysfunction)   . COPD (chronic obstructive pulmonary disease)   . Shortness of breath   . Stroke 1998    vision trouble  . Degenerative disc disease     LS  . Cholecystitis 10/04/2012  . 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 Right bundle branch block   . Dyspnea on exertion   . Family history of heart disease   . Edema extremities     bilateral lower extremities-"was recently placed on fluid pill"   Past Surgical History  Procedure Laterality Date  . Hernia repair Left 1998  . Eye surgery  2000    left eye; muscle surgery  . Rectal abscess  10/2002    foreign body  . Cholecystectomy  10/29/2012    Procedure: LAPAROSCOPIC CHOLECYSTECTOMY WITH INTRAOPERATIVE CHOLANGIOGRAM;  Surgeon: Joyice Faster. Cornett, MD;  Location: [place];  Service: General;  Laterality: N/A;  laparoscopic cholecystectomy with intraoperative cholangiogram  . Colonoscopy  multiple  . Esophagogastroduodenoscopy    . Ct perc  cholecystostomy  2013  . Cataract extraction, bilateral Bilateral   . Colonoscopy N/A 06/30/2014    Procedure: COLONOSCOPY;  Surgeon: Gatha Mayer, MD;  Location: [facility];  Service: Endoscopy;  Laterality: N/A;  . Hot hemostasis N/A 06/30/2014    Procedure: HOT HEMOSTASIS (ARGON PLASMA COAGULATION/BICAP);  Surgeon: Gatha Mayer, MD;  Location: [facility];  Service: Endoscopy;  Laterality: N/A;   Family History  Problem Relation Age of Onset  . Heart disease Mother     CAD  . Stroke Father   . Diabetes Sister   . Hypertension Sister   . Heart disease Brother     MI  . Colon cancer Neg Hx    History  Substance Use Topics  . Smoking status: Current Every Day Smoker -- 1.00 packs/day for 64 years    Types: Cigarettes  . Smokeless tobacco: Never Used     Comment: tried chantix but cannot use it/not sure about OTC meds for smoking cessation   . Alcohol Use: No    Review of Systems  Unable to perform ROS: Acuity of condition  Constitutional: Negative for fever.  Gastrointestinal: Positive for vomiting.      Allergies  Chantix  Home Medications   Prior to Admission medications   Medication Sig Start Date End Date Taking? Authorizing Provider  aspirin 81 MG tablet Take 1 tablet (81 mg total) by mouth daily. STOP UNTIL July 14, 2014 06/30/14   Gatha Mayer, MD  colestipol (COLESTID)  5 G packet Take 5 g by mouth daily with supper. 06/30/14   Gatha Mayer, MD  ferrous sulfate 325 (65 FE) MG tablet Take 325 mg by mouth every other day.    Historical Provider, MD  furosemide (LASIX) 40 MG tablet Take 0.5 tablets (20 mg total) by mouth every morning. 06-17-14 recently started this 07/17/14   Brett Canales, PA-C  levothyroxine (SYNTHROID, LEVOTHROID) 175 MCG tablet Take 175 mcg by mouth daily before breakfast.    Historical Provider, MD  potassium chloride SA (K-DUR,KLOR-CON) 20 MEQ tablet Take 0.5 tablets (10 mEq total) by mouth daily. 07/17/14   Brett Canales, PA-C   Tiotropium Bromide-Olodaterol (STIOLTO RESPIMAT) 2.5-2.5 MCG/ACT AERS Inhale 2 puffs into the lungs daily. 07/22/14   Tanda Rockers, MD  vitamin B-12 (CYANOCOBALAMIN) 1000 MCG tablet Take 1,000 mcg by mouth daily.    Historical Provider, MD   BP 34/20 mmHg  Pulse 93  Temp(Src) 97 F (36.1 C) (Temporal)  Resp 11  Wt 140 lb (63.504 kg)  SpO2 43% Physical Exam  Constitutional: He appears well-developed and well-nourished. He appears distressed.  Mottled above the nipples  HENT:  Head: Normocephalic and atraumatic.  Mouth/Throat: No oropharyngeal exudate.  Eyes: EOM are normal. Pupils are equal, round, and reactive to light.  Neck: Normal range of motion. Neck supple.  Cardiovascular: Normal rate and regular rhythm.  Exam reveals no friction rub.   No murmur heard. Pulmonary/Chest: Breath sounds normal. He is in respiratory distress (agonal). He has no wheezes. He has no rales.  Abdominal: He exhibits no distension. There is no tenderness. There is no rebound.  Musculoskeletal: Normal range of motion. He exhibits no edema.  Neurological: He is unresponsive. GCS eye subscore is 1. GCS verbal subscore is 1. GCS motor subscore is 1.  Skin: No rash noted. He is not diaphoretic.  Nursing note and vitals reviewed.   ED Course  Procedures (including critical care time) Labs Review Labs Reviewed  CBC - Abnormal; Notable for the following:    RDW 17.4 (*)    Platelets 147 (*)    All other components within normal limits  BASIC METABOLIC PANEL - Abnormal; Notable for the following:    Glucose, Bld 138 (*)    GFR calc non Af Amer 49 (*)    GFR calc Af Amer 57 (*)    Anion gap 20 (*)    All other components within normal limits  I-STAT CG4 LACTIC ACID, ED - Abnormal; Notable for the following:    Lactic Acid, Venous 6.93 (*)    All other components within normal limits  I-STAT ARTERIAL BLOOD GAS, ED - Abnormal; Notable for the following:    pH, Arterial 7.142 (*)    pO2, Arterial  357.0 (*)    Bicarbonate 15.2 (*)    Acid-base deficit 13.0 (*)    All other components within normal limits  I-STAT CHEM 8, ED - Abnormal; Notable for the following:    Sodium 136 (*)    Potassium 3.6 (*)    Glucose, Bld 139 (*)    Calcium, Ion 1.05 (*)    All other components within normal limits  I-STAT TROPOININ, ED    Imaging Review Ct Abdomen Pelvis W Contrast  2014-08-28   CLINICAL DATA:  Evaluate liver lesions on prior CT.  EXAM: CT ABDOMEN AND PELVIS WITH CONTRAST  TECHNIQUE: Multidetector CT imaging of the abdomen and pelvis was performed using the standard protocol following bolus administration of  intravenous contrast.  CONTRAST:  80 mL Omnipaque 300 IV  COMPARISON:  CTA chest dated 07/20/2014. CT abdomen pelvis dated 09/13/2012.  FINDINGS: Motion degraded images.  Lower chest:  Emphysematous changes at the lung bases.  Hepatobiliary: Numerous enhancing lesions throughout the liver, suspicious for metastases, including:  --3.6 x 3.5 cm lesion in the lateral segment left hepatic lobe (series 3/ image 11)  --2.3 x 2.3 cm lesion in the medial segment left hepatic lobe (series 3/ image 13)  --1.8 x 1.5 cm lesion in the posterior segment right hepatic dome (series 3/image 8)  Additionally, there is a very large area of geographic low density in the central right hepatic lobe (series 3/ image 13) which is favored to reflect central necrosis with an ill-defined, dominant right hepatic lobe lesion.  Status post cholecystectomy. No intrahepatic or extrahepatic ductal dilatation.  Pancreas: Within normal limits.  Spleen: Within normal limits.  Adrenals/Urinary Tract: Adrenal glands are unremarkable.  Small left renal cysts measuring up to 9 mm in the medial left upper pole (series 2/ image 21). Right kidney is within normal limits. No hydronephrosis.  Bladder is thick-walled but underdistended.  Stomach/Bowel: Stomach is unremarkable.  No evidence of bowel obstruction.  Normal appendix.  Wall  thickening involving the ascending colon and rectosigmoid region, possibly reflecting infectious/inflammatory colitis.  Vascular/Lymphatic: Atherosclerotic calcifications of the abdominal aorta and branch vessels.  No suspicious abdominopelvic lymphadenopathy.  Reproductive: Prostatomegaly, with enlargement of the central gland which indents the base of the bladder.  Other: Small volume perihepatic ascites.  No gross peritoneal nodularity.  Body wall edema.  Musculoskeletal: Mild degenerative changes of the visualized thoracolumbar spine.  IMPRESSION: Numerous enhancing lesions throughout the liver, suspicious for metastases, with index lesions as described above. While nonspecific, this appearance raises the possibility of a primary neuroendocrine/carcinoid tumor.  Wall thickening involving the ascending colon and rectosigmoid region, possibly reflecting infectious/ inflammatory colitis.  Small volume perihepatic ascites.  No gross peritoneal nodularity.   Electronically Signed   By: Julian Hy M.D.   On: 09-19-14 17:11   Dg Chest Port 1 View  2014-09-19   CLINICAL DATA:  Hypoxia status post cardiac arrest  EXAM: PORTABLE CHEST - 1 VIEW  COMPARISON:  April 28, 2013  FINDINGS: The endotracheal tube tip is 5.9 cm above the carina. No pneumothorax. There is underlying emphysematous change. There is moderate generalized edema. There is no airspace consolidation. The heart size is normal. The pulmonary vascularity is within normal limits. No adenopathy.  IMPRESSION: Endotracheal tube as described without pneumothorax. Moderate interstitial edema; question noncardiogenic edema given lack of cardiomegaly. Underlying emphysematous change. No airspace consolidation.   Electronically Signed   By: Lowella Grip M.D.   On: 2014-09-19 15:16     EKG Interpretation None     Cardiopulmonary Resuscitation (CPR) Procedure Note Directed/Performed by: Osvaldo Shipper I personally directed ancillary staff  and/or performed CPR in an effort to regain return of spontaneous circulation and to maintain cardiac, neuro and systemic perfusion.   CRITICAL CARE Performed by: Osvaldo Shipper   Total critical care time: 30 minutes  Critical care time was exclusive of separately billable procedures and treating other patients.  Critical care was necessary to treat or prevent imminent or life-threatening deterioration.  Critical care was time spent personally by me on the following activities: development of treatment plan with patient and/or surrogate as well as nursing, discussions with consultants, evaluation of patient's response to treatment, examination of patient, obtaining history from patient  or surrogate, ordering and performing treatments and interventions, ordering and review of laboratory studies, ordering and review of radiographic studies, pulse oximetry and re-evaluation of patient's condition.  MDM   Final diagnoses:  Cardiac arrest    80 year old male presents unresponsive after coating while getting a CT scan. He had return of spontaneous circulation 10 minutes after CPR was initiated. There was concern that he aspirated. Upon arrival, he is having had all respirations with the [facility] airway in place. We performed RSI with a small dose of etomidate and rocuronium to exchange the tube. After airway was secured, we had to do another 5 minutes of CPR. He had another returns spontaneous circulation. We started levo fed through peripheral IV and he required another several minutes of CPR about 7-8 minutes later. We had a return of circulation after each round of CPR. Patient was extremely mottled and his appearance was in extremis. I went to speak with family and they requested that we do not do any further care and withdraw care by pulling the tube. I explained to them that he had been paralyzed and he will ultimately lead to death for sure if we pull the tube now. They are okay with this  as he was a DO NOT INTUBATE, did not notice. Patient's family accompanied the patient after the ventilator was discontinued and his ET tube was pulled.      Evelina Bucy, MD 08/27/14 [phone]

## 2014-08-27 NOTE — Progress Notes (Signed)
Quick Note:  I called his significant other and reviewed things. She was understanding and did not have other ? After his death (coded at [place]) ______

## 2014-09-15 NOTE — ED Notes (Signed)
Per [facility], pt from [place] office across street for CPR. Had a witnessed arrest and 15 min of CPR given. Given 3 rounds of epi and heart rate regained at [place] on monitor. Intubated with king airway. Last pressure on arrival 91/67, 22g to [facility]

## 2014-09-15 NOTE — ED Notes (Signed)
NOTIFIED DR. Mingo Amber FOR PATIENTS LAB RESULTS OF CG4+ LACTIC ACID ,I-STAT CHEM 8+ , I-STAT TROPONIN ,@ 15:14 PM ,September 05, 2014.

## 2014-09-15 NOTE — Code Documentation (Signed)
Family updated as to patient's status. And at the bedside.

## 2014-09-15 NOTE — Progress Notes (Signed)
   09-12-2014 1500  Clinical Encounter Type  Visited With Patient and family together;Health care provider  Visit Type Initial;ED;Patient actively dying  Spiritual Encounters  Spiritual Needs Grief support  Stress Factors  Family Stress Factors Loss   Chaplain was paged at 2:25PM for a patient in Trauma C post-CPR. The medical team was working on the patient when chaplain arrived. Patient notified chaplain that the family of the patient was in the ED Lobby. Patient introduced himself to the patient's girlfriend/partner of over thirty years and her daughter. Both of these persons were identified as the patient's power of attorneys. Chaplain escorted family to consultation B. Patient's partner explained that the patient was at a CT scan and became seriously ill after the scan was over and coded. Patient's partner further explained that the patient has been sick for some time but it wasn't until the CT scan today that they found out he had cancer. Chaplain informed the patient's family that the medical team was still working with the patient. At this time, patient's partner indicated that he was a DNR and was highly concerned that the medical team was still working with the patient. Chaplain informed the medical team of the partner's wishes and the attending ED physician came to consult with the family. After consultation, patient's family decided that they wished for the patient to be extubated and that they be with him as he naturally passes away. These wishes were respected by the medical team. Chaplain escorted family to Trauma C where they are currently grieving alongside the patient. Patient's family is sad but are glad that soon he will no longer suffer from his sickness. 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 Page Elodia Florence chaplain if further grief support or other assistance is needed. Gar Ponto,  Chaplain  3:44 PM 3:39 PM

## 2014-09-15 NOTE — Telephone Encounter (Signed)
[place] Code Blue initiated in [place]. 1355  Pt unresponsive, face dusky purple CPR in progress, pad placed anterior/posterior 1356  Agonal breathing, chest rise/fall           CPR continued, no pulse, bagged respirations 1357  1 amp Epi given,  1358  Stop CPR, suctioning, rhythm sinus tachycardia, bundle branch block 1400  Fire Dept arrived, 1 amp Epi given 1402  Sinus tach, BBB, no pulse, continue CPR, thumper placed 1407  1 amp Epi given, copious amounts pink frothy substance suctioned orally            BVM, neck veins engorged.  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

## 2014-09-15 NOTE — Telephone Encounter (Signed)
Lisa @ [facility] called to let us know that patient went for CT abdomen and pelvis for liver lesions. Patient coded on the table. They dont think it was related to the contrast. They did send the patient to the hospital. Per current hospital notes, it appears they did CPR for about 15 minutes and he is currently admitted. Dr Carlean Purl has been called on his cell phone and advised.

## 2014-09-15 NOTE — ED Notes (Signed)
Family at bedside. 

## 2014-09-15 NOTE — Progress Notes (Signed)
    Code Note  We were called to a Code Blue in the [place] Pt was found to be unresponsive - no pulse, no respirations. He was very cyanotic. CPR was started.  ambu bag and mask respirations were initiated.  CPR pads were applied. Initial rhythm was sinus - no pulse or BP Dx with PEA  A total of 3 amp of Epi was given.  He had copious secretions coming from his throat.  We suctioned, Attempted intubation several times. Eventually a King airway was placed by Perrin Maltese. EMT-P.  CPR was continued,  Color gradually improved.  Monitor showed sinus tach.  Femoral pulse was palpated. He seemed to have some spontaneous movement.  Was transported to the ED for further evaluation and management.   Thayer Headings, Brooke Bonito., MD, [facility] 14-Sep-2014, 3:46 PM [address],  [place] Pager [phone]

## 2014-09-15 DEATH — deceased

## 2015-06-21 IMAGING — CT CT ABD-PELV W/ CM
2 of 4 series · 16 of 46 positions shown, 18 images · IV contrast (APPLIED)
Comparison: CTA chest dated 07/20/2014. CT abdomen pelvis dated
09/13/2012.

CLINICAL DATA: Evaluate liver lesions on prior CT.

EXAM:
CT ABDOMEN AND PELVIS WITH CONTRAST
TECHNIQUE: Multidetector CT imaging of the abdomen and pelvis was performed
using the standard protocol following bolus administration of
intravenous contrast.
CONTRAST:  80 mL Omnipaque 300 IV

[Series 2: abd/ pel 5mm · axial · 0.70mm/px · z∈[-459,-64]mm · 13 of 87 slices shown, 15 images]
[im 4/87  soft-tissue]
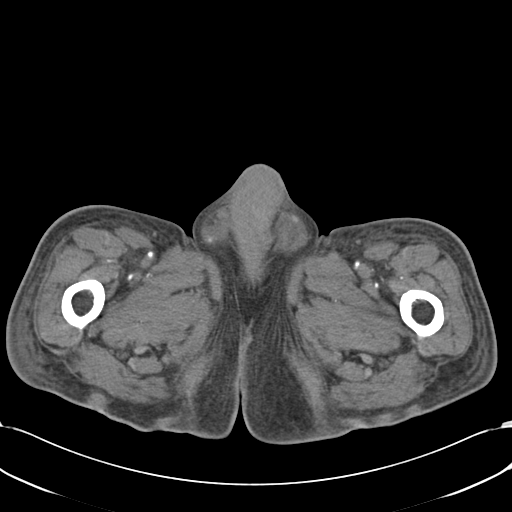
[im 4/87  bone]
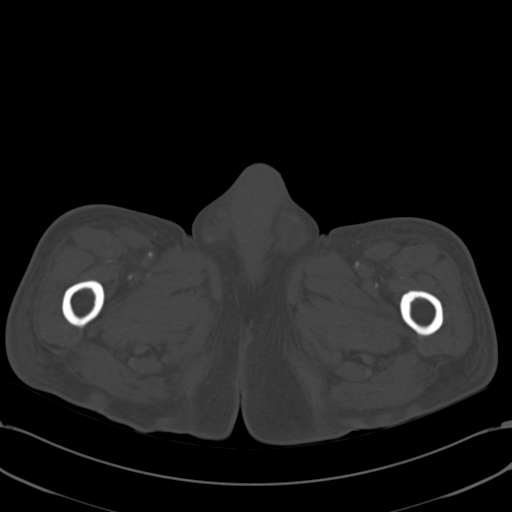
[im 12/87  soft-tissue]
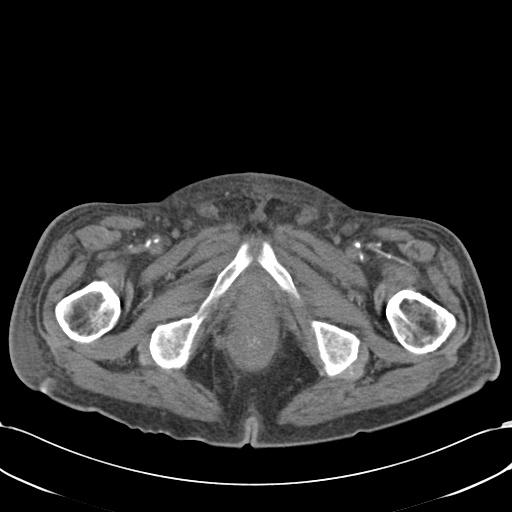
[im 19/87  soft-tissue]
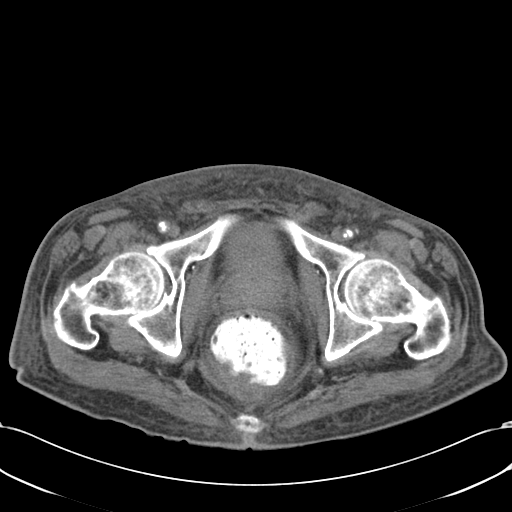
[im 23/87  soft-tissue]
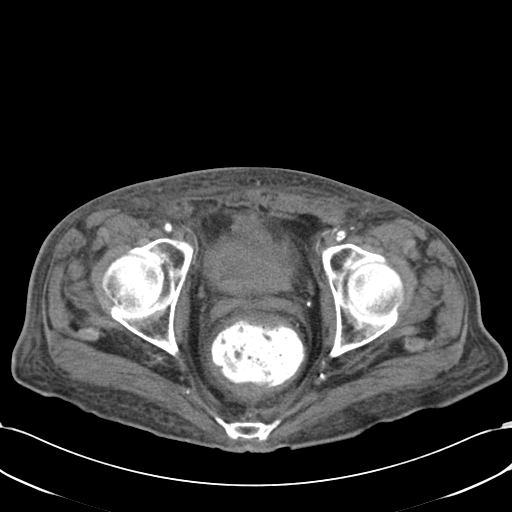
[im 30/87  soft-tissue]
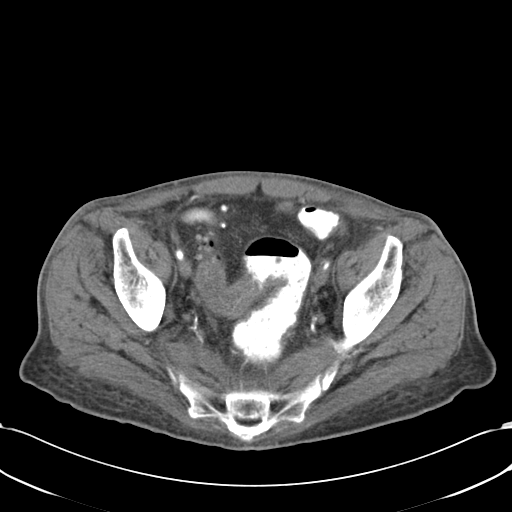
[im 38/87  soft-tissue]
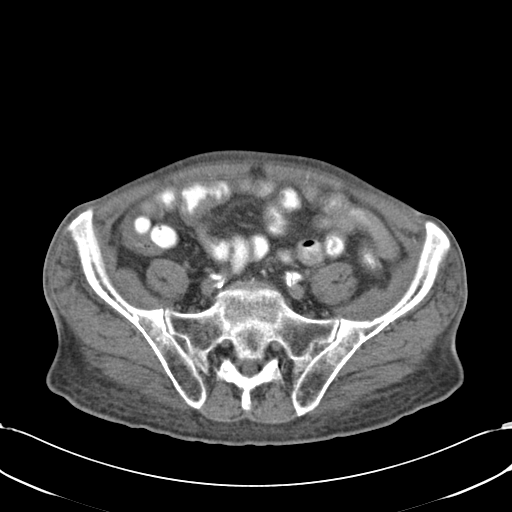
[im 45/87  soft-tissue]
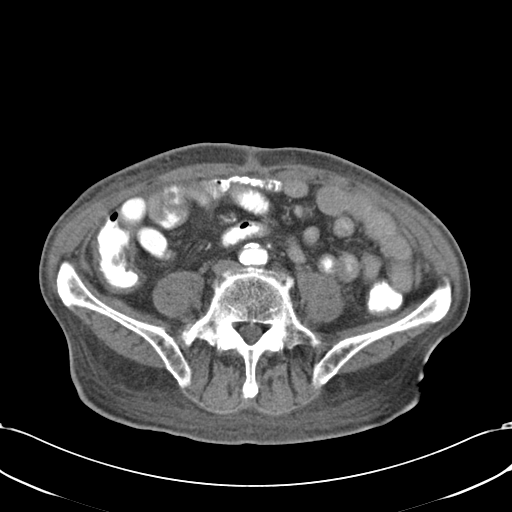
[im 49/87  soft-tissue]
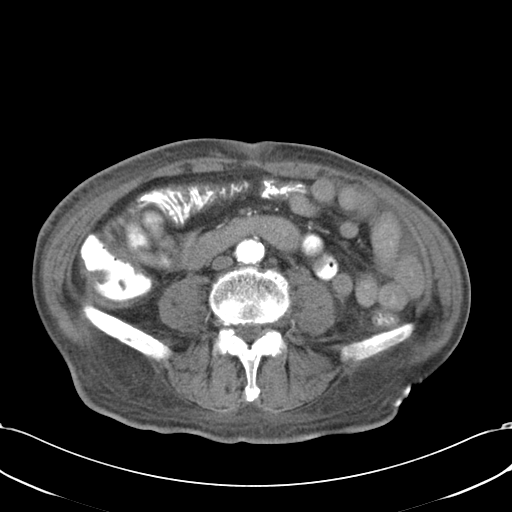
[im 57/87  soft-tissue]
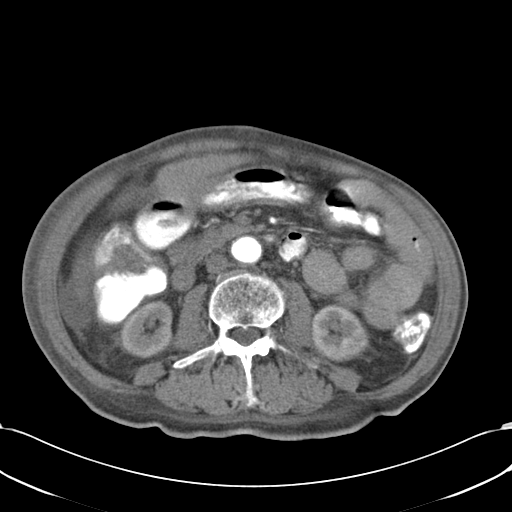
[im 57/87  bone]
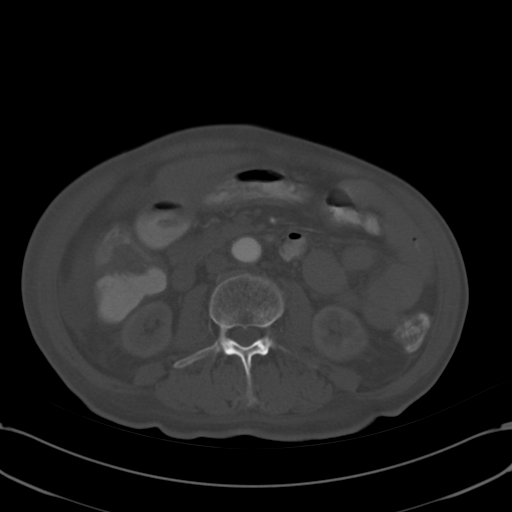
[im 64/87  soft-tissue]
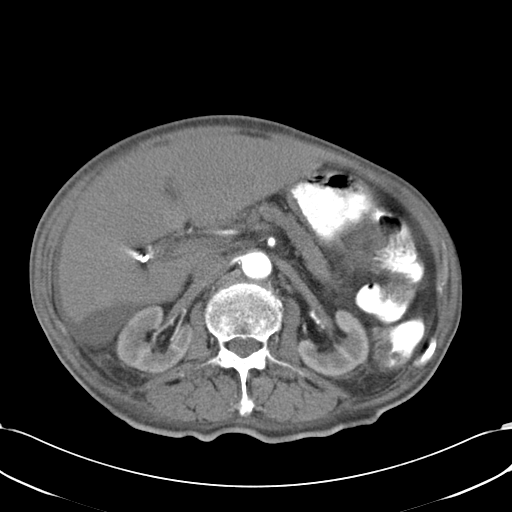
[im 68/87  soft-tissue]
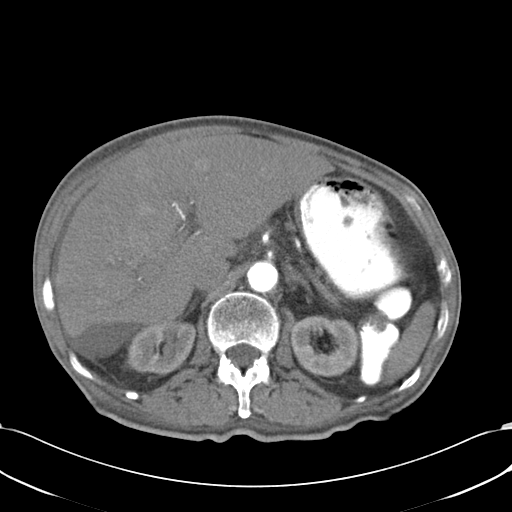
[im 75/87  soft-tissue]
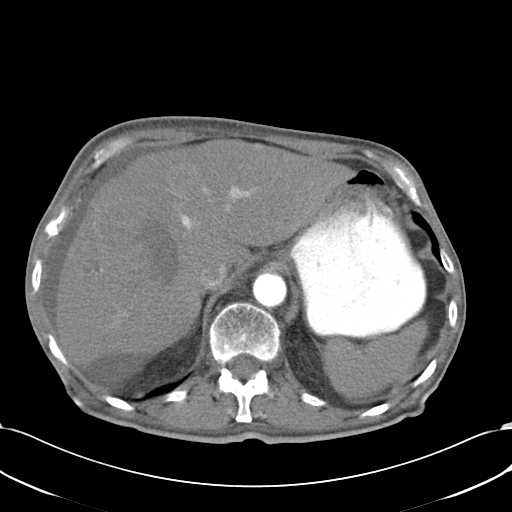
[im 83/87  soft-tissue]
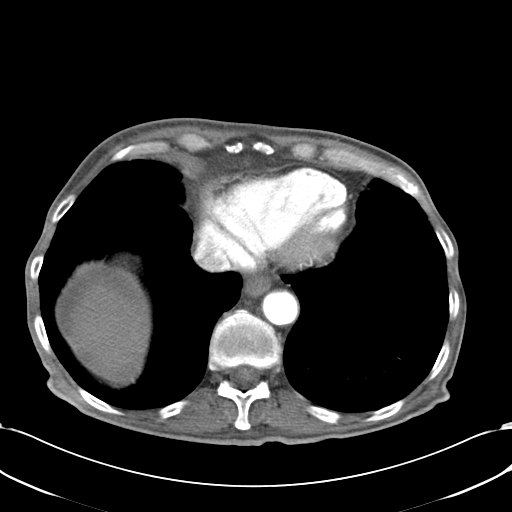

[Series 602: <mpr range> · coronal · 0.88mm/px · 3 of 99 slices shown]
[im 33/99  soft-tissue]
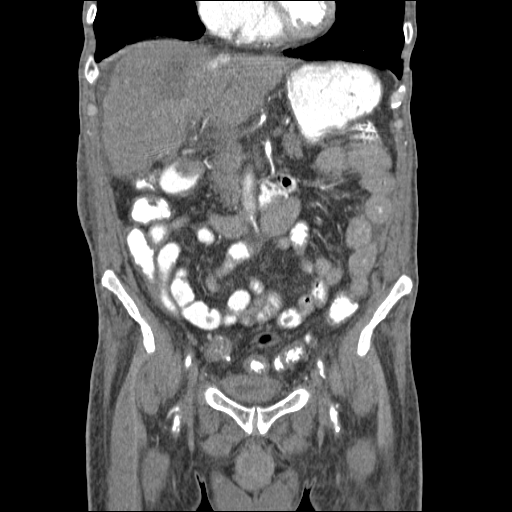
[im 44/99  soft-tissue]
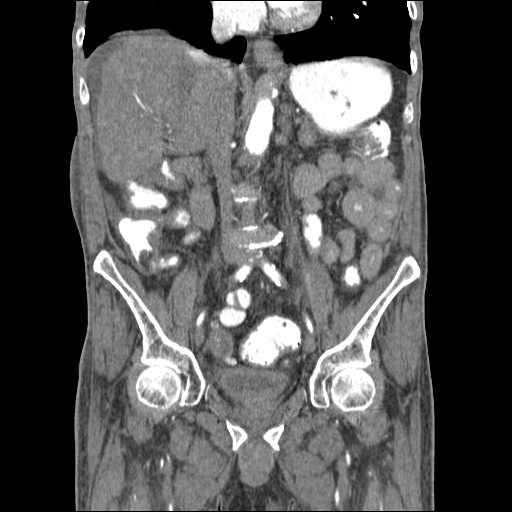
[im 55/99  soft-tissue]
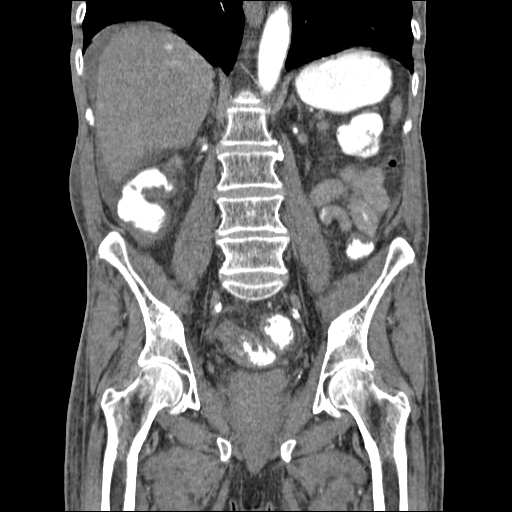

[16 of 46 positions shown; findings below may reference images not displayed]

FINDINGS: Motion degraded images.

Lower chest:  Emphysematous changes at the lung bases.

Hepatobiliary: Numerous enhancing lesions throughout the liver,
suspicious for metastases, including:

--3.6 x 3.5 cm lesion in the lateral segment left hepatic lobe
(series 3/ image 11)

--2.3 x 2.3 cm lesion in the medial segment left hepatic lobe
(series 3/ image 13)

--1.8 x 1.5 cm lesion in the posterior segment right hepatic dome
(series 3/image 8)

Additionally, there is a very large area of geographic low density
in the central right hepatic lobe (series 3/ image 13) which is
favored to reflect central necrosis with an ill-defined, dominant
right hepatic lobe lesion.

Status post cholecystectomy. No intrahepatic or extrahepatic ductal
dilatation.

Pancreas: Within normal limits.

Spleen: Within normal limits.

Adrenals/Urinary Tract: Adrenal glands are unremarkable.

Small left renal cysts measuring up to 9 mm in the medial left upper
pole (series 2/ image 21). Right kidney is within normal limits. No
hydronephrosis.

Bladder is thick-walled but underdistended.

Stomach/Bowel: Stomach is unremarkable.

No evidence of bowel obstruction.

Normal appendix.

Wall thickening involving the ascending colon and rectosigmoid
region, possibly reflecting infectious/inflammatory colitis.

Vascular/Lymphatic: Atherosclerotic calcifications of the abdominal
aorta and branch vessels.

No suspicious abdominopelvic lymphadenopathy.

Reproductive: Prostatomegaly, with enlargement of the central gland
which indents the base of the bladder.

Other: Small volume perihepatic ascites.

No gross peritoneal nodularity.

Body wall edema.

Musculoskeletal: Mild degenerative changes of the visualized
thoracolumbar spine.
IMPRESSION: Numerous enhancing lesions throughout the liver, suspicious for
metastases, with index lesions as described above. While
nonspecific, this appearance raises the possibility of a primary
neuroendocrine/carcinoid tumor.

Wall thickening involving the ascending colon and rectosigmoid
region, possibly reflecting infectious/ inflammatory colitis.

Small volume perihepatic ascites.  No gross peritoneal nodularity.
